# Patient Record
Sex: Female | Born: 1980 | Race: Black or African American | Hispanic: No | Smoking: Former smoker
Health system: Southern US, Community
[De-identification: ages and names within clinical notes are randomized; demographics above are authoritative.]

## PROBLEM LIST (undated history)

## (undated) ENCOUNTER — Inpatient Hospital Stay (HOSPITAL_COMMUNITY): Payer: Self-pay

## (undated) DIAGNOSIS — O139 Gestational [pregnancy-induced] hypertension without significant proteinuria, unspecified trimester: Secondary | ICD-10-CM

## (undated) DIAGNOSIS — R51 Headache: Secondary | ICD-10-CM

## (undated) DIAGNOSIS — D219 Benign neoplasm of connective and other soft tissue, unspecified: Secondary | ICD-10-CM

## (undated) DIAGNOSIS — R0602 Shortness of breath: Secondary | ICD-10-CM

## (undated) DIAGNOSIS — A749 Chlamydial infection, unspecified: Secondary | ICD-10-CM

## (undated) HISTORY — PX: TOOTH EXTRACTION: SUR596

---
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
## 2000-10-07 ENCOUNTER — Emergency Department (HOSPITAL_COMMUNITY): Admission: EM | Admit: 2000-10-07 | Discharge: 2000-10-07 | Payer: Self-pay | Admitting: Emergency Medicine

## 2000-10-25 ENCOUNTER — Inpatient Hospital Stay (HOSPITAL_COMMUNITY): Admission: AD | Admit: 2000-10-25 | Discharge: 2000-10-25 | Payer: Self-pay | Admitting: Obstetrics

## 2003-04-23 ENCOUNTER — Ambulatory Visit (HOSPITAL_COMMUNITY): Admission: RE | Admit: 2003-04-23 | Discharge: 2003-04-23 | Payer: Self-pay | Admitting: *Deleted

## 2003-04-29 ENCOUNTER — Inpatient Hospital Stay (HOSPITAL_COMMUNITY): Admission: AD | Admit: 2003-04-29 | Discharge: 2003-04-30 | Payer: Self-pay | Admitting: *Deleted

## 2003-05-14 ENCOUNTER — Inpatient Hospital Stay (HOSPITAL_COMMUNITY): Admission: AD | Admit: 2003-05-14 | Discharge: 2003-05-14 | Payer: Self-pay | Admitting: Obstetrics and Gynecology

## 2003-06-03 ENCOUNTER — Inpatient Hospital Stay (HOSPITAL_COMMUNITY): Admission: AD | Admit: 2003-06-03 | Discharge: 2003-06-03 | Payer: Self-pay | Admitting: Obstetrics and Gynecology

## 2003-06-18 ENCOUNTER — Ambulatory Visit (HOSPITAL_COMMUNITY): Admission: RE | Admit: 2003-06-18 | Discharge: 2003-06-18 | Payer: Self-pay | Admitting: *Deleted

## 2003-10-11 ENCOUNTER — Inpatient Hospital Stay (HOSPITAL_COMMUNITY): Admission: AD | Admit: 2003-10-11 | Discharge: 2003-10-11 | Payer: Self-pay | Admitting: Gynecology

## 2003-10-29 ENCOUNTER — Inpatient Hospital Stay (HOSPITAL_COMMUNITY): Admission: AD | Admit: 2003-10-29 | Discharge: 2003-10-29 | Payer: Self-pay | Admitting: Obstetrics and Gynecology

## 2003-10-31 ENCOUNTER — Inpatient Hospital Stay (HOSPITAL_COMMUNITY): Admission: AD | Admit: 2003-10-31 | Discharge: 2003-10-31 | Payer: Self-pay | Admitting: *Deleted

## 2003-11-05 ENCOUNTER — Inpatient Hospital Stay (HOSPITAL_COMMUNITY): Admission: AD | Admit: 2003-11-05 | Discharge: 2003-11-05 | Payer: Self-pay | Admitting: Obstetrics and Gynecology

## 2003-11-06 ENCOUNTER — Inpatient Hospital Stay (HOSPITAL_COMMUNITY): Admission: AD | Admit: 2003-11-06 | Discharge: 2003-11-06 | Payer: Self-pay | Admitting: Gynecology

## 2003-11-10 ENCOUNTER — Inpatient Hospital Stay (HOSPITAL_COMMUNITY): Admission: AD | Admit: 2003-11-10 | Discharge: 2003-11-16 | Payer: Self-pay | Admitting: Obstetrics and Gynecology

## 2003-11-10 ENCOUNTER — Ambulatory Visit: Payer: Self-pay | Admitting: Obstetrics and Gynecology

## 2004-06-22 ENCOUNTER — Emergency Department (HOSPITAL_COMMUNITY): Admission: EM | Admit: 2004-06-22 | Discharge: 2004-06-22 | Payer: Self-pay | Admitting: Emergency Medicine

## 2005-08-12 ENCOUNTER — Emergency Department (HOSPITAL_COMMUNITY): Admission: EM | Admit: 2005-08-12 | Discharge: 2005-08-12 | Payer: Self-pay | Admitting: Emergency Medicine

## 2006-12-06 ENCOUNTER — Emergency Department: Payer: Self-pay | Admitting: Emergency Medicine

## 2007-09-18 ENCOUNTER — Emergency Department: Payer: Self-pay | Admitting: Emergency Medicine

## 2008-10-17 ENCOUNTER — Emergency Department: Payer: Self-pay | Admitting: Emergency Medicine

## 2011-09-21 ENCOUNTER — Encounter (HOSPITAL_COMMUNITY): Payer: Self-pay | Admitting: *Deleted

## 2011-09-21 ENCOUNTER — Emergency Department (HOSPITAL_COMMUNITY)
Admission: EM | Admit: 2011-09-21 | Discharge: 2011-09-22 | Disposition: A | Discharge status: Home / Self Care | Payer: Self-pay | Attending: Emergency Medicine | Admitting: Emergency Medicine

## 2011-09-21 DIAGNOSIS — Z0389 Encounter for observation for other suspected diseases and conditions ruled out: Secondary | ICD-10-CM | POA: Insufficient documentation

## 2011-09-21 NOTE — ED Notes (Signed)
Patient reports she has had pain since Monday in the posterior area.  Patient states the pain is sharp and feels like a nail is being driving into her head.  Patient states she could see the veins popping in her head

## 2011-09-21 NOTE — ED Notes (Signed)
Called twice-no answer

## 2012-03-09 ENCOUNTER — Encounter (HOSPITAL_COMMUNITY): Payer: Self-pay

## 2012-03-09 ENCOUNTER — Inpatient Hospital Stay (HOSPITAL_COMMUNITY)
Admission: AD | Admit: 2012-03-09 | Discharge: 2012-03-09 | Disposition: A | Discharge status: Home / Self Care | Payer: Medicaid Other | Source: Ambulatory Visit | Attending: Obstetrics & Gynecology | Admitting: Obstetrics & Gynecology

## 2012-03-09 DIAGNOSIS — N390 Urinary tract infection, site not specified: Secondary | ICD-10-CM | POA: Insufficient documentation

## 2012-03-09 DIAGNOSIS — Z3201 Encounter for pregnancy test, result positive: Secondary | ICD-10-CM

## 2012-03-09 DIAGNOSIS — R109 Unspecified abdominal pain: Secondary | ICD-10-CM | POA: Insufficient documentation

## 2012-03-09 DIAGNOSIS — O2340 Unspecified infection of urinary tract in pregnancy, unspecified trimester: Secondary | ICD-10-CM

## 2012-03-09 DIAGNOSIS — O210 Mild hyperemesis gravidarum: Secondary | ICD-10-CM | POA: Insufficient documentation

## 2012-03-09 DIAGNOSIS — O98819 Other maternal infectious and parasitic diseases complicating pregnancy, unspecified trimester: Secondary | ICD-10-CM | POA: Insufficient documentation

## 2012-03-09 DIAGNOSIS — O2390 Unspecified genitourinary tract infection in pregnancy, unspecified trimester: Secondary | ICD-10-CM | POA: Insufficient documentation

## 2012-03-09 DIAGNOSIS — A5901 Trichomonal vulvovaginitis: Secondary | ICD-10-CM

## 2012-03-09 HISTORY — DX: Headache: R51

## 2012-03-09 LAB — URINALYSIS, ROUTINE W REFLEX MICROSCOPIC
Bilirubin Urine: NEGATIVE
Specific Gravity, Urine: 1.025 (ref 1.005–1.030)
pH: 6.5 (ref 5.0–8.0)

## 2012-03-09 LAB — URINE MICROSCOPIC-ADD ON

## 2012-03-09 MED ORDER — ONDANSETRON 8 MG PO TBDP
8.0000 mg | ORAL_TABLET | Freq: Once | ORAL | Status: DC
  Filled 2012-03-09: qty 1

## 2012-03-09 MED ORDER — PROMETHAZINE HCL 25 MG PO TABS
12.5000 mg | ORAL_TABLET | Freq: Four times a day (QID) | ORAL | Status: DC | PRN
Start: 2012-03-09 — End: 2012-04-22

## 2012-03-09 MED ORDER — METRONIDAZOLE 500 MG PO TABS
2000.0000 mg | ORAL_TABLET | Freq: Once | ORAL | Status: AC
  Administered 2012-03-09: 2000 mg via ORAL
  Filled 2012-03-09: qty 4

## 2012-03-09 MED ORDER — NITROFURANTOIN MONOHYD MACRO 100 MG PO CAPS: 100.0000 mg | ORAL_CAPSULE | Freq: Two times a day (BID) | ORAL | Status: AC

## 2012-03-09 NOTE — MAU Note (Signed)
Patient states she had a positive home pregnancy test on 11-28. To MAU for confirmation. States she has been having upper abdominal and nausea for about 2 weeks. Has only vomited a couple of times. Has headaches daily, had this am but not at this time. Denies any bleeding or discharge.

## 2012-03-09 NOTE — MAU Provider Note (Signed)
History     CSN: [id number]  Arrival date and time: 03/09/12 4098   First Provider Initiated Contact with Patient 03/09/12 1140      Chief Complaint  Patient presents with  . Possible Pregnancy  . Abdominal Pain   HPI Comments: Katie Garrett is a G2P1001 31 y.o. Female with an estimated gestational age of [redacted]w[redacted]d by LMP 12/14/11 who presents today for confirmation of pregnancy. Patient took an at home pregnancy test on 11/28 and needs a confirmation letter from [place] today. She reports some shooting pains up and down and lengthwise across her stomach that mostly bother her at night, but occur sometimes throughout the day. Also reports headaches and nausea everyday. Vomiting comes and goes, is not everyday. Has not taken anything at this time. Had daily nausea and vomiting with first pregnancy. Could not find a medication that was helpful during that time. Has not had prenatal care at this point. Also reports increased urinary frequency and dark urine even though patient is drinking lots of water.  Denies any urinary urgency, dysuria, hematuria, vaginal discharge, vaginal bleeding or pelvic pain.   Abdominal Pain Associated symptoms include frequency, headaches, nausea and vomiting. Pertinent negatives include no dysuria, fever or hematuria.     Past Medical History  Diagnosis Date  . Headache     Past Surgical History  Procedure Date  . Tooth extraction   . Cesarean section     History reviewed. No pertinent family history.  History  Substance Use Topics  . Smoking status: Current Every Day Smoker -- 0.2 packs/day    Types: Cigarettes  . Smokeless tobacco: Not on file  . Alcohol Use: No    Allergies: No Known Allergies  No prescriptions prior to admission    Review of Systems  Constitutional: Negative for fever and chills.  Gastrointestinal: Positive for nausea, vomiting and abdominal pain.  Genitourinary: Positive for frequency. Negative for dysuria, urgency and  hematuria.       Patient reports dark urine    Neurological: Positive for headaches.   Physical Exam   Blood pressure 120/73, pulse 76, temperature 98.8 F (37.1 C), temperature source Oral, resp. rate 16, height 4\' 11"  (1.499 m), weight 73.029 kg (161 lb), last menstrual period 12/14/2011, SpO2 100.00%.  Physical Exam  Nursing note and vitals reviewed. Constitutional: She is oriented to person, place, and time. She appears well-developed and well-nourished. She appears distressed.  GI: Soft. She exhibits no distension. There is no tenderness.  Neurological: She is alert and oriented to person, place, and time.  Skin: Skin is warm and dry.  Psychiatric: She has a normal mood and affect. Her behavior is normal.     Results for orders placed during the hospital encounter of 03/09/12 (from the past 24 hour(s))  URINALYSIS, ROUTINE W REFLEX MICROSCOPIC     Status: Abnormal   Collection Time   03/09/12 10:00 AM      Component Value Range   Color, Urine YELLOW  YELLOW   APPearance CLEAR  CLEAR   Specific Gravity, Urine 1.025  1.005 - 1.030   pH 6.5  5.0 - 8.0   Glucose, UA NEGATIVE  NEGATIVE mg/dL   Hgb urine dipstick MODERATE (*) NEGATIVE   Bilirubin Urine NEGATIVE  NEGATIVE   Ketones, ur NEGATIVE  NEGATIVE mg/dL   Protein, ur NEGATIVE  NEGATIVE mg/dL   Urobilinogen, UA 0.2  0.0 - 1.0 mg/dL   Nitrite NEGATIVE  NEGATIVE   Leukocytes, UA SMALL (*) NEGATIVE  URINE MICROSCOPIC-ADD ON     Status: Abnormal   Collection Time   03/09/12 10:00 AM      Component Value Range   Squamous Epithelial / LPF FEW (*) RARE   WBC, UA 11-20  <3 WBC/hpf   RBC / HPF 3-6  <3 RBC/hpf   Urine-Other MUCOUS PRESENT    POCT PREGNANCY, URINE     Status: Abnormal   Collection Time   03/09/12 10:03 AM      Component Value Range   Preg Test, Ur POSITIVE (*) NEGATIVE     MAU Course  Procedures FHR by Doppler: 158 bpm GC/Chlamydia probe (pending)   Assessment and Plan  A: 31 y.o. G59P1001 female  with estimated GA of [redacted]w[redacted]d by LMP 12/14/11 1. Positive pregnancy test  Patient to make appt. with Dr. Clearance Coots 2. Trichomonas Vaginalis  Flagyl 2000 mg once here  Zofran OTD 8 mg  Advised patient that partner needs to be treated also  No intercourse for 2 weeks after partner has been treated 3. UTI in pregnancy  Macrobid 100 mg BID x 7 days 4. Nausea in pregnancy  Phenergan 12.5 mg PRN     Marnee Spring 03/09/2012, 12:22 PM   I have seen this patient and agree with the above PA student's note.  Trichomonas present in urine on microscopic add on.  GC/Chlamydia collected and pending.   LEFTWICH-KIRBY, Rhianna Raulerson Certified Nurse-Midwife

## 2012-03-11 LAB — GC/CHLAMYDIA PROBE AMP
CT Probe RNA: NEGATIVE
GC Probe RNA: NEGATIVE

## 2012-03-12 NOTE — MAU Provider Note (Signed)
Attestation of Attending Supervision of Advanced Practitioner (CNM/NP): Evaluation and management procedures were performed by the Advanced Practitioner under my supervision and collaboration.  I have reviewed the Advanced Practitioner's note and chart, and I agree with the management and plan.  Raeleigh Guinn, MD, FACOG Attending Obstetrician & Gynecologist Faculty Practice, [facility]  

## 2012-04-04 HISTORY — PX: TUBAL LIGATION: SHX77

## 2012-04-22 ENCOUNTER — Encounter (HOSPITAL_COMMUNITY): Payer: Self-pay | Admitting: *Deleted

## 2012-04-22 ENCOUNTER — Inpatient Hospital Stay (HOSPITAL_COMMUNITY)
Admission: AD | Admit: 2012-04-22 | Discharge: 2012-04-22 | Disposition: A | Discharge status: Home / Self Care | Payer: Medicaid Other | Source: Ambulatory Visit | Attending: Obstetrics and Gynecology | Admitting: Obstetrics and Gynecology

## 2012-04-22 DIAGNOSIS — N390 Urinary tract infection, site not specified: Secondary | ICD-10-CM

## 2012-04-22 DIAGNOSIS — O2340 Unspecified infection of urinary tract in pregnancy, unspecified trimester: Secondary | ICD-10-CM

## 2012-04-22 DIAGNOSIS — O2390 Unspecified genitourinary tract infection in pregnancy, unspecified trimester: Secondary | ICD-10-CM

## 2012-04-22 DIAGNOSIS — R109 Unspecified abdominal pain: Secondary | ICD-10-CM | POA: Insufficient documentation

## 2012-04-22 DIAGNOSIS — O368190 Decreased fetal movements, unspecified trimester, not applicable or unspecified: Secondary | ICD-10-CM | POA: Insufficient documentation

## 2012-04-22 DIAGNOSIS — B9689 Other specified bacterial agents as the cause of diseases classified elsewhere: Secondary | ICD-10-CM

## 2012-04-22 DIAGNOSIS — N760 Acute vaginitis: Secondary | ICD-10-CM

## 2012-04-22 LAB — WET PREP, GENITAL
Trich, Wet Prep: NONE SEEN
Yeast Wet Prep HPF POC: NONE SEEN

## 2012-04-22 LAB — URINALYSIS, ROUTINE W REFLEX MICROSCOPIC
Bilirubin Urine: NEGATIVE
Glucose, UA: NEGATIVE mg/dL
Ketones, ur: NEGATIVE mg/dL
Leukocytes, UA: NEGATIVE
Nitrite: NEGATIVE
Protein, ur: NEGATIVE mg/dL
Specific Gravity, Urine: 1.025 (ref 1.005–1.030)
Urobilinogen, UA: 0.2 mg/dL (ref 0.0–1.0)
pH: 7 (ref 5.0–8.0)

## 2012-04-22 LAB — URINE MICROSCOPIC-ADD ON

## 2012-04-22 MED ORDER — CEPHALEXIN 500 MG PO CAPS: 500.0000 mg | ORAL_CAPSULE | Freq: Two times a day (BID) | ORAL | Status: DC

## 2012-04-22 MED ORDER — METRONIDAZOLE 500 MG PO TABS: 500.0000 mg | ORAL_TABLET | Freq: Two times a day (BID) | ORAL | Status: DC

## 2012-04-22 NOTE — MAU Note (Signed)
Pt states that she has not felt her baby and she was afraid. States some lower abdominal cramping

## 2012-04-22 NOTE — MAU Provider Note (Signed)
History     CSN: [id number]  Arrival date & time 04/22/12  1459   None     Chief Complaint  Patient presents with  . no fetal movement     (Consider location/radiation/quality/duration/timing/severity/associated sxs/prior treatment) HPI Arliss L Berneta Sages is a 32 y.o. G2P1001 at [redacted]w[redacted]d. She presents with concerns about fetal well being. She hasn't felt true fetal movement, has felt fluttering since December but not yesterday. She has noticed low ML pressure/crampng x 2 days.  No bleeding or spotting, no change in discharge, odor or itching. No UTI S&S or GI changes.  She has an appt 1/22 with [facility] to start prenatal care.   Past Medical History  Diagnosis Date  . Headache     Past Surgical History  Procedure Date  . Tooth extraction   . Cesarean section     History reviewed. No pertinent family history.  History  Substance Use Topics  . Smoking status: Current Every Day Smoker -- 0.2 packs/day    Types: Cigarettes  . Smokeless tobacco: Not on file  . Alcohol Use: No    OB History    Grav Para Term Preterm Abortions TAB SAB Ect Mult Living   2 1 1       1       Review of Systems  Constitutional: Negative for fever and chills.  Gastrointestinal: Negative for nausea, vomiting, diarrhea and constipation.  Genitourinary: Positive for pelvic pain. Negative for dysuria, urgency, frequency, vaginal bleeding and vaginal discharge.    Allergies  Review of patient's allergies indicates no known allergies.  Home Medications  No current outpatient prescriptions on file.  BP 136/75  Pulse 89  Resp 18  LMP 12/14/2011  Physical Exam  Constitutional: She is oriented to person, place, and time. She appears well-developed and well-nourished.  Abdominal: Soft. There is no tenderness.       + FHT's  Genitourinary:       Pelvic: Ext gen- nl anatomy, skin intact Vagina- small amt bright white discharge Cx= parous, long,closed Uterus- 18-20 wk size Adn- non tender    Musculoskeletal: Normal range of motion.  Neurological: She is alert and oriented to person, place, and time.  Skin: Skin is warm and dry.  Psychiatric: She has a normal mood and affect. Her behavior is normal.    ED Course  Procedures (including critical care time)    Results for orders placed during the hospital encounter of 04/22/12 (from the past 24 hour(s))  URINALYSIS, ROUTINE W REFLEX MICROSCOPIC     Status: Abnormal   Collection Time   04/22/12  3:00 PM      Component Value Range   Color, Urine YELLOW  YELLOW   APPearance HAZY (*) CLEAR   Specific Gravity, Urine 1.025  1.005 - 1.030   pH 7.0  5.0 - 8.0   Glucose, UA NEGATIVE  NEGATIVE mg/dL   Hgb urine dipstick TRACE (*) NEGATIVE   Bilirubin Urine NEGATIVE  NEGATIVE   Ketones, ur NEGATIVE  NEGATIVE mg/dL   Protein, ur NEGATIVE  NEGATIVE mg/dL   Urobilinogen, UA 0.2  0.0 - 1.0 mg/dL   Nitrite NEGATIVE  NEGATIVE   Leukocytes, UA NEGATIVE  NEGATIVE  URINE MICROSCOPIC-ADD ON     Status: Abnormal   Collection Time   04/22/12  3:00 PM      Component Value Range   Squamous Epithelial / LPF FEW (*) RARE   WBC, UA 3-6  <3 WBC/hpf   RBC / HPF 0-2  <  3 RBC/hpf   Bacteria, UA MANY (*) RARE  WET PREP, GENITAL     Status: Abnormal   Collection Time   04/22/12  4:00 PM      Component Value Range   Yeast Wet Prep HPF POC NONE SEEN  NONE SEEN   Trich, Wet Prep NONE SEEN  NONE SEEN   Clue Cells Wet Prep HPF POC MODERATE (*) NONE SEEN   WBC, Wet Prep HPF POC FEW (*) NONE SEEN    ASSESSMENT:  18 + wks EGA with + FHT's Urinary tract infection Bacterial vaginosis   PLAN:  C&S on urine, tx with Keflex 500 bid x 7 d Flagyl 500 bid x 7 d Keep appt 1/22 with [facility] to start prenatal care    MDM

## 2012-04-23 LAB — GC/CHLAMYDIA PROBE AMP
CT Probe RNA: NEGATIVE
GC Probe RNA: NEGATIVE

## 2012-04-23 LAB — URINE CULTURE

## 2012-04-25 ENCOUNTER — Other Ambulatory Visit: Payer: Self-pay | Admitting: Nurse Practitioner

## 2012-04-25 DIAGNOSIS — Z3689 Encounter for other specified antenatal screening: Secondary | ICD-10-CM

## 2012-04-27 ENCOUNTER — Ambulatory Visit (HOSPITAL_COMMUNITY)
Admission: RE | Admit: 2012-04-27 | Discharge: 2012-04-27 | Disposition: A | Discharge status: Home / Self Care | Payer: Medicaid Other | Source: Ambulatory Visit | Attending: Nurse Practitioner | Admitting: Nurse Practitioner

## 2012-04-27 DIAGNOSIS — Z363 Encounter for antenatal screening for malformations: Secondary | ICD-10-CM | POA: Insufficient documentation

## 2012-04-27 DIAGNOSIS — Z3689 Encounter for other specified antenatal screening: Secondary | ICD-10-CM

## 2012-04-27 DIAGNOSIS — O0930 Supervision of pregnancy with insufficient antenatal care, unspecified trimester: Secondary | ICD-10-CM | POA: Insufficient documentation

## 2012-04-27 DIAGNOSIS — Z1389 Encounter for screening for other disorder: Secondary | ICD-10-CM | POA: Insufficient documentation

## 2012-04-27 DIAGNOSIS — O358XX0 Maternal care for other (suspected) fetal abnormality and damage, not applicable or unspecified: Secondary | ICD-10-CM | POA: Insufficient documentation

## 2012-05-01 NOTE — MAU Provider Note (Signed)
Attestation of Attending Supervision of Advanced Practitioner: Evaluation and management procedures were performed by the PA/NP/CNM/OB Fellow under my supervision/collaboration. Chart reviewed and agree with management and plan.  Angelino Rumery V 05/01/2012 4:11 PM

## 2012-08-23 ENCOUNTER — Encounter: Payer: Self-pay | Admitting: *Deleted

## 2012-08-30 ENCOUNTER — Inpatient Hospital Stay (HOSPITAL_COMMUNITY)
Admission: AD | Admit: 2012-08-30 | Discharge: 2012-09-03 | DRG: 766 | Disposition: A | Discharge status: Home / Self Care | Payer: Medicaid Other | Source: Ambulatory Visit | Attending: Family Medicine | Admitting: Family Medicine

## 2012-08-30 ENCOUNTER — Inpatient Hospital Stay (HOSPITAL_COMMUNITY): Payer: Medicaid Other

## 2012-08-30 ENCOUNTER — Encounter (HOSPITAL_COMMUNITY): Payer: Self-pay | Admitting: *Deleted

## 2012-08-30 DIAGNOSIS — O330 Maternal care for disproportion due to deformity of maternal pelvic bones: Secondary | ICD-10-CM | POA: Diagnosis present

## 2012-08-30 DIAGNOSIS — O1493 Unspecified pre-eclampsia, third trimester: Secondary | ICD-10-CM

## 2012-08-30 DIAGNOSIS — Z98891 History of uterine scar from previous surgery: Secondary | ICD-10-CM

## 2012-08-30 DIAGNOSIS — O339 Maternal care for disproportion, unspecified: Secondary | ICD-10-CM | POA: Diagnosis present

## 2012-08-30 DIAGNOSIS — O34219 Maternal care for unspecified type scar from previous cesarean delivery: Secondary | ICD-10-CM | POA: Diagnosis present

## 2012-08-30 DIAGNOSIS — IMO0002 Reserved for concepts with insufficient information to code with codable children: Principal | ICD-10-CM | POA: Diagnosis present

## 2012-08-30 DIAGNOSIS — O324XX0 Maternal care for high head at term, not applicable or unspecified: Secondary | ICD-10-CM | POA: Diagnosis present

## 2012-08-30 HISTORY — DX: Gestational (pregnancy-induced) hypertension without significant proteinuria, unspecified trimester: O13.9

## 2012-08-30 HISTORY — DX: Shortness of breath: R06.02

## 2012-08-30 HISTORY — DX: Chlamydial infection, unspecified: A74.9

## 2012-08-30 LAB — CREATININE, URINE, 24 HOUR
Collection Interval-UCRE24: 24 hours
Creatinine, 24H Ur: 1778 mg/d (ref 700–1800)
Creatinine, Urine: 79 mg/dL
Urine Total Volume-UCRE24: 2250 mL

## 2012-08-30 LAB — OB RESULTS CONSOLE RPR: RPR: NONREACTIVE

## 2012-08-30 LAB — PROTEIN, URINE, 24 HOUR
Collection Interval-UPROT: 24 hours
Urine Total Volume-UPROT: 2250 mL

## 2012-08-30 LAB — CBC
HCT: 29.1 % — ABNORMAL LOW (ref 36.0–46.0)
Hemoglobin: 9.2 g/dL — ABNORMAL LOW (ref 12.0–15.0)
RBC: 3.95 MIL/uL (ref 3.87–5.11)
WBC: 8.7 10*3/uL (ref 4.0–10.5)

## 2012-08-30 LAB — OB RESULTS CONSOLE HIV ANTIBODY (ROUTINE TESTING): HIV: NONREACTIVE

## 2012-08-30 LAB — URINALYSIS, ROUTINE W REFLEX MICROSCOPIC
Ketones, ur: NEGATIVE mg/dL
Leukocytes, UA: NEGATIVE
Nitrite: NEGATIVE
Protein, ur: NEGATIVE mg/dL
Urobilinogen, UA: 0.2 mg/dL (ref 0.0–1.0)

## 2012-08-30 LAB — URINE MICROSCOPIC-ADD ON

## 2012-08-30 LAB — COMPREHENSIVE METABOLIC PANEL
ALT: 12 U/L (ref 0–35)
BUN: 6 mg/dL (ref 6–23)
Calcium: 8.7 mg/dL (ref 8.4–10.5)
Creatinine, Ser: 0.69 mg/dL (ref 0.50–1.10)
GFR calc Af Amer: >90 mL/min (ref >90)
Glucose, Bld: 103 mg/dL — ABNORMAL HIGH (ref 70–99)
Sodium: 137 mEq/L (ref 135–145)
Total Protein: 6.1 g/dL (ref 6.0–8.3)

## 2012-08-30 LAB — OB RESULTS CONSOLE HEPATITIS B SURFACE ANTIGEN: Hepatitis B Surface Ag: NEGATIVE

## 2012-08-30 LAB — RAPID HIV SCREEN (WH-MAU): Rapid HIV Screen: NONREACTIVE

## 2012-08-30 LAB — RPR: RPR Ser Ql: NONREACTIVE

## 2012-08-30 LAB — OB RESULTS CONSOLE RUBELLA ANTIBODY, IGM: Rubella: IMMUNE

## 2012-08-30 LAB — OB RESULTS CONSOLE ANTIBODY SCREEN: Antibody Screen: NEGATIVE

## 2012-08-30 MED ORDER — IBUPROFEN 600 MG PO TABS
600.0000 mg | ORAL_TABLET | Freq: Four times a day (QID) | ORAL | Status: DC | PRN
  Administered 2012-08-30: 600 mg via ORAL
  Filled 2012-08-30: qty 1

## 2012-08-30 MED ORDER — LACTATED RINGERS IV SOLN: 500.0000 mL | INTRAVENOUS | Status: DC | PRN

## 2012-08-30 MED ORDER — CITRIC ACID-SODIUM CITRATE 334-500 MG/5ML PO SOLN
30.0000 mL | ORAL | Status: DC | PRN
  Administered 2012-09-01: 30 mL via ORAL
  Filled 2012-08-30: qty 15

## 2012-08-30 MED ORDER — LACTATED RINGERS IV SOLN
INTRAVENOUS | Status: DC
  Administered 2012-08-30 – 2012-09-01 (×3): via INTRAVENOUS

## 2012-08-30 MED ORDER — OXYTOCIN 40 UNITS IN LACTATED RINGERS INFUSION - SIMPLE MED: 62.5000 mL/h | INTRAVENOUS | Status: DC

## 2012-08-30 MED ORDER — ACETAMINOPHEN 325 MG PO TABS
650.0000 mg | ORAL_TABLET | ORAL | Status: DC | PRN
  Administered 2012-08-30: 650 mg via ORAL
  Filled 2012-08-30: qty 2

## 2012-08-30 MED ORDER — NALBUPHINE SYRINGE 5 MG/0.5 ML
10.0000 mg | INJECTION | INTRAMUSCULAR | Status: DC | PRN
  Administered 2012-08-31 – 2012-09-01 (×4): 10 mg via INTRAVENOUS
  Filled 2012-08-30 (×4): qty 1

## 2012-08-30 MED ORDER — MAGNESIUM SULFATE BOLUS VIA INFUSION
4.0000 g | Freq: Once | INTRAVENOUS | Status: DC
  Filled 2012-08-30: qty 500

## 2012-08-30 MED ORDER — LIDOCAINE HCL (PF) 1 % IJ SOLN: 30.0000 mL | INTRAMUSCULAR | Status: DC | PRN

## 2012-08-30 MED ORDER — ONDANSETRON HCL 4 MG/2ML IJ SOLN
4.0000 mg | Freq: Four times a day (QID) | INTRAMUSCULAR | Status: DC | PRN
  Administered 2012-08-31: 4 mg via INTRAVENOUS
  Filled 2012-08-30: qty 2

## 2012-08-30 MED ORDER — OXYTOCIN BOLUS FROM INFUSION: 500.0000 mL | INTRAVENOUS | Status: DC

## 2012-08-30 MED ORDER — OXYCODONE-ACETAMINOPHEN 5-325 MG PO TABS: 1.0000 | ORAL_TABLET | ORAL | Status: DC | PRN

## 2012-08-30 MED ORDER — TERBUTALINE SULFATE 1 MG/ML IJ SOLN: 0.2500 mg | Freq: Once | INTRAMUSCULAR | Status: AC | PRN

## 2012-08-30 MED ORDER — MAGNESIUM SULFATE 40 G IN LACTATED RINGERS - SIMPLE
2.0000 g/h | INTRAVENOUS | Status: AC
  Administered 2012-08-30: 4 g/h via INTRAVENOUS
  Administered 2012-08-31: 2 g/h via INTRAVENOUS
  Filled 2012-08-30 (×3): qty 500

## 2012-08-30 NOTE — MAU Note (Signed)
Patient sent from clinic for evaluation of elevated blood pressure.

## 2012-08-30 NOTE — H&P (Signed)
Katie Garrett is a 32 y.o. female presenting for elevated blood pressure.   History  32 y.o. G2P1001 at [redacted]w[redacted]d [facility]. On Tuesday, BPs were 140s/90s. Today she had a BP of 150s/90s. She has collected a 24-hour urine sample and brings that with her today. She denies headache, vision changes, RUQ pain, nausea, vomiting. Baby is moving well and she has no contractions, bleeding or loss of fluid.   [facility] and has had no problems with this pregnancy. Her previous pregnancy was delivered by c-section due to non-reassuring fetal heart tones. She states she dilated to 5 cm.    OB History   Grav Para Term Preterm Abortions TAB SAB Ect Mult Living   2 1 1       1      Past Medical History  Diagnosis Date  . ZOXWRUEA(540.9)    Past Surgical History  Procedure Laterality Date  . Tooth extraction    . Cesarean section     Family History: family history is not on file. Social History:  reports that she quit smoking about 4 months ago. Her smoking use included Cigarettes. She smoked 0.25 packs per day. She does not have any smokeless tobacco history on file. She reports that she does not drink alcohol or use illicit drugs.   Prenatal Transfer Tool  Maternal Diabetes: No Genetic Screening: Abnormal:  Results: Elevated AFP (borderline) Maternal Ultrasounds/Referrals: Normal Fetal Ultrasounds or other Referrals:  None Maternal Substance Abuse:  No Significant Maternal Medications:  None Significant Maternal Lab Results:  Lab values include: Group B Strep negative Other Comments:  None  ROS  Pertinent pos and neg listed in HPI    Blood pressure 154/91, pulse 77, temperature 97.9 F (36.6 C), temperature source Oral, resp. rate 20, last menstrual period 12/14/2011, SpO2 100.00%. Maternal Exam:  Uterine Assessment: Contraction strength is mild.  Contraction frequency is irregular.   Abdomen: Patient reports no  abdominal tenderness. Surgical scars: low transverse.   Fetal presentation: vertex  Introitus: Normal vulva. Normal vagina.  Ferning test: not done.  Nitrazine test: not done.  Pelvis: questionable for delivery.   Cervix: Cervix evaluated by digital exam.     Fetal Exam Fetal Monitor Review: Mode: ultrasound.   Baseline rate: 135.  Variability: moderate (6-25 bpm).   Pattern: accelerations present and no decelerations.    Fetal State Assessment: Category I - tracings are normal.     Physical Exam  Constitutional: She is oriented to person, place, and time. She appears well-developed and well-nourished. No distress.  HENT:  Head: Normocephalic and atraumatic.  Eyes: Conjunctivae and EOM are normal.  Neck: Normal range of motion. Neck supple.  Cardiovascular: Normal rate, regular rhythm and normal heart sounds.   Respiratory: Breath sounds normal. No respiratory distress.  GI: Soft. Bowel sounds are normal.  Musculoskeletal: Normal range of motion. She exhibits edema (1+). She exhibits no tenderness.  Neurological: She is alert and oriented to person, place, and time. She has normal reflexes.  Skin: Skin is warm and dry.  Psychiatric: She has a normal mood and affect.      Prenatal labs: ABO, Rh:   Antibody:   Rubella:   RPR:    HBsAg:    HIV:    GBS:     Assessment/Plan: 32 y.o. G2P1001 at [redacted]w[redacted]d with Preeclampsia with prior C-section - desires VBAC and BTL - Admit to L&D for induction of  labor - TOLAC consent signed - BTL consent signed - good after 08/18/12 - GBS negative - Mag - Monitor BP, treat if >160/110 - Foley bulb if possible or pitocin    Napoleon Form 08/30/2012, 3:22 PM

## 2012-08-30 NOTE — H&P (Signed)
Chart reviewed and agree with management and plan.  

## 2012-08-31 LAB — CBC
HCT: 33.3 % — ABNORMAL LOW (ref 36.0–46.0)
Hemoglobin: 10.7 g/dL — ABNORMAL LOW (ref 12.0–15.0)
RBC: 4.51 MIL/uL (ref 3.87–5.11)
RDW: 14.9 % (ref 11.5–15.5)
WBC: 14.8 10*3/uL — ABNORMAL HIGH (ref 4.0–10.5)

## 2012-08-31 MED ORDER — EPHEDRINE 5 MG/ML INJ: 10.0000 mg | INTRAVENOUS | Status: DC | PRN

## 2012-08-31 MED ORDER — TERBUTALINE SULFATE 1 MG/ML IJ SOLN: 0.2500 mg | Freq: Once | INTRAMUSCULAR | Status: AC | PRN

## 2012-08-31 MED ORDER — MORPHINE SULFATE 0.5 MG/ML IJ SOLN
INTRAMUSCULAR | Status: AC
  Filled 2012-08-31: qty 10

## 2012-08-31 MED ORDER — OXYTOCIN 40 UNITS IN LACTATED RINGERS INFUSION - SIMPLE MED
1.0000 m[IU]/min | INTRAVENOUS | Status: DC
  Administered 2012-08-31: 2 m[IU]/min via INTRAVENOUS
  Filled 2012-08-31: qty 1000

## 2012-08-31 MED ORDER — PHENYLEPHRINE 40 MCG/ML (10ML) SYRINGE FOR IV PUSH (FOR BLOOD PRESSURE SUPPORT): 80.0000 ug | PREFILLED_SYRINGE | INTRAVENOUS | Status: DC | PRN

## 2012-08-31 MED ORDER — LACTATED RINGERS IV SOLN: 500.0000 mL | Freq: Once | INTRAVENOUS | Status: DC

## 2012-08-31 MED ORDER — SODIUM BICARBONATE 8.4 % IV SOLN
INTRAVENOUS | Status: AC
  Filled 2012-08-31: qty 50

## 2012-08-31 MED ORDER — OXYTOCIN 10 UNIT/ML IJ SOLN
INTRAMUSCULAR | Status: AC
  Filled 2012-08-31: qty 4

## 2012-08-31 MED ORDER — ONDANSETRON HCL 4 MG/2ML IJ SOLN
INTRAMUSCULAR | Status: AC
  Filled 2012-08-31: qty 2

## 2012-08-31 MED ORDER — DIPHENHYDRAMINE HCL 50 MG/ML IJ SOLN: 12.5000 mg | INTRAMUSCULAR | Status: DC | PRN

## 2012-08-31 MED ORDER — LIDOCAINE-EPINEPHRINE (PF) 2 %-1:200000 IJ SOLN
INTRAMUSCULAR | Status: AC
  Filled 2012-08-31: qty 20

## 2012-08-31 MED ORDER — LABETALOL HCL 5 MG/ML IV SOLN: 20.0000 mg | INTRAVENOUS | Status: DC | PRN

## 2012-08-31 MED ORDER — FENTANYL 2.5 MCG/ML BUPIVACAINE 1/10 % EPIDURAL INFUSION (WH - ANES): 14.0000 mL/h | INTRAMUSCULAR | Status: DC | PRN

## 2012-08-31 NOTE — Progress Notes (Signed)
Katie Garrett is a 32 y.o. G2P1001 at [redacted]w[redacted]d by ultrasound admitted for induction of labor due to Pre-eclamptic toxemia of pregnancy..  Subjective: Pt has contracted regularly, used ball to aid position, still c slight bloody show.  Objective: BP 149/86  Pulse 89  Temp(Src) 98.7 F (37.1 C) (Oral)  Resp 20  Ht 5\' 1"  (1.549 m)  Wt 83.462 kg (184 lb)  BMI 34.78 kg/m2  SpO2 100%  LMP 12/14/2011 I/O last 3 completed shifts: In: 4657.1 [P.O.:1360; I.V.:3297.1] Out: 2100 [Urine:2100] Total I/O In: 723.6 [P.O.:340; I.V.:383.6] Out: 1125 [Urine:1125]  FHT:  FHR: 135 bpm, variability: minimal ,  accelerations:  Present,  decelerations:  Absent UC:   irregular, every 3-4 minutes SVE:   Dilation: 1 Effacement (%): Thick Station: Ballotable Exam by:: Dr Gaye Pollack remains out of pelvis.  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  Patient accepts lack of progress as indicative of narrow pelvis. Cesarean section discussed, including risks to adjacent organs, risk of bleeding or transfusion. Will recheck platelets . Type and screen renewed. To OR at 12:30 for repeat cesarean and tubal ligation. Consents signed over 30 days (4/18)  Hiliana Eilts V 08/31/2012, 11:28 PM

## 2012-08-31 NOTE — Progress Notes (Signed)
Katie Garrett is a 32 y.o. G2P1001 at [redacted]w[redacted]d by ultrasound admitted for induction of labor due to preeclampsia, with elevated BP and proteinuria 450 mg/d.  Subjective: Pt having contractions mild intensity, uncomfotable to pt. Foley bulb has been in place x 28 hours, on Pitocin now at 9 mU/min with contractions q 4 min.  Pt still desiring to continue with TOLAC. "I just want the joy of pushing my baby out"   Objective: BP 147/81  Pulse 85  Temp(Src) 98.7 F (37.1 C) (Oral)  Resp 18  Ht 5\' 1"  (1.549 m)  Wt 83.462 kg (184 lb)  BMI 34.78 kg/m2  SpO2 100%  LMP 12/14/2011 I/O last 3 completed shifts: In: 4657.1 [P.O.:1360; I.V.:3297.1] Out: 2100 [Urine:2100] Total I/O In: 133.6 [I.V.:133.6] Out: 0  BP 147/81  Pulse 85  Temp(Src) 98.7 F (37.1 C) (Oral)  Resp 18  Ht 5\' 1"  (1.549 m)  Wt 83.462 kg (184 lb)  BMI 34.78 kg/m2  SpO2 100%  LMP 12/14/2011  FHT:  FHR: 135 bpm, variability: moderate,  accelerations:  Present,  decelerations:  Absent UC:   irregular, every 3-5 minutes SVE:   Dilation: 1 Effacement (%): Thick Station:  (not in pelvis) Exam by:: Dr. Emelda Garrett fOLEY BULB STILL IN CERVIX, BLOODY SHOW HAS BEGUN. PELVIS IS VERY NARROW, AND vertex is out of the pelvis, confimed as vertex by u/s at bedside by me, with fetus in midline,spine anterior, Vertex direct OA. Nuchal cord visible on u.s. Labs: Lab Results  Component Value Date   WBC 8.7 08/30/2012   HGB 9.2* 08/30/2012   HCT 29.1* 08/30/2012   MCV 73.7* 08/30/2012   PLT 207 08/30/2012    Assessment / Plan: Induction of labor due to preeclampsia,  progressing well on pitocin with foley bulb in cervix,  Narrow pelvis,  Prior cesarean at C/C , DUE TO nrfht,  Doubtful pelvic diameters.  Labor: will continue with oxytocin,and foley. will allow pt to use ball to sit on,  Preeclampsia:  on magnesium sulfate Fetal Wellbeing:  Category I Pain Control:  Labor support without medications I/D:  n/a Anticipated MOD:  I  am doubtful of adequacy of pelvis, but will allow pt to continue per her strong preference, recheck q 2 hrs.  Katie Garrett V 08/31/2012, 8:43 PM

## 2012-08-31 NOTE — Progress Notes (Signed)
Patient ID: DELAINEY WINSTANLEY, female   DOB: [birth year], 31 y.o.   MRN: [id number]   S:  Pt having nausea/vomiting. Had headache last night. Felt some ctx last night but none now. Foley bulb still in.  O:  Filed Vitals:   08/31/12 0603 08/31/12 0703 08/31/12 0802 08/31/12 0903  BP: 144/82 143/87 148/96 141/90  Pulse: 77 75 84 83  Temp:   98 F (36.7 C)   TempSrc:   Oral   Resp: 18 18 18 18   Height:      Weight:      SpO2:       Cervix:  Check deferred  FHTs:  120, mod var, accels present, no decels  TOCO:  occ ctx  A/P 32 y.o. G2P1001 at [redacted]w[redacted]d with Preeclampsia - BP controlled, on mag - Start low dose pitocin  - TOLAC - monitor closely  Napoleon Form, MD

## 2012-08-31 NOTE — Progress Notes (Signed)
Katie Garrett is a 32 y.o. G2P1001 at [redacted]w[redacted]d   Subjective: Rested during the night; intermittent H/A relieved with Nubain; on magnesium  Objective: BP 148/96  Pulse 84  Temp(Src) 98 F (36.7 C) (Oral)  Resp 18  Ht 5\' 1"  (1.549 m)  Wt 83.462 kg (184 lb)  BMI 34.78 kg/m2  SpO2 100%  LMP 12/14/2011 I/O last 3 completed shifts: In: 2465.4 [P.O.:640; I.V.:1825.4] Out: 925 [Urine:925] Total I/O In: 25 [I.V.:25] Out: 150 [Urine:150]  FHT:  FHR: 120 bpm, variability: moderate,  accelerations:  Present,  decelerations:  Absent UC:   Rare ctx; no pattern SVE:   Dilation: Closed Effacement (%): Thick Station: -3 Exam by:: Dr. Thad Ranger- foley bulb with tension still in place (cx not examined)  Labs: Lab Results  Component Value Date   WBC 8.7 08/30/2012   HGB 9.2* 08/30/2012   HCT 29.1* 08/30/2012   MCV 73.7* 08/30/2012   PLT 207 08/30/2012    Assessment / Plan: IOL process TOLAC Preeclampsia on magnesium Unfavorable cx  Continue with foley bulb ripening; possibly add Pitocin   Katie Garrett 08/31/2012, 8:12 AM

## 2012-08-31 NOTE — Progress Notes (Signed)
Patient ID: Katie Garrett, female   DOB: 08/07/1980, 32 y.o.   MRN: [id number]   S: Pt states pain with ctx is getting strong. Foley bulb still in.  O: Filed Vitals:   08/31/12 1403 08/31/12 1502 08/31/12 1603 08/31/12 1702  BP: 152/91 153/89 154/86 147/93  Pulse: 91 93 98 94  Temp:      TempSrc:      Resp: 18 18 18 18   Height:      Weight:      SpO2:        CERV:  2cm effacement difficult to determine with FB in place, -2 station  FHTs:  130, mod var, accels present, no decels TOCO:   q 3-5 min  A/P 32 y.o. G2P1001 at [redacted]w[redacted]d here for IOL for Preeclampsia, TOLAC - BP not severe range, no severe symptoms - Continue magnesium - FHTs reactive - No progress.  Will continue to increase pitocin.  Napoleon Form, MD

## 2012-09-01 ENCOUNTER — Inpatient Hospital Stay (HOSPITAL_COMMUNITY): Payer: Medicaid Other | Admitting: Anesthesiology

## 2012-09-01 ENCOUNTER — Encounter (HOSPITAL_COMMUNITY)
Admission: AD | Disposition: A | Discharge status: Home / Self Care | Payer: Self-pay | Source: Ambulatory Visit | Attending: Family Medicine

## 2012-09-01 ENCOUNTER — Encounter (HOSPITAL_COMMUNITY): Payer: Self-pay | Admitting: Anesthesiology

## 2012-09-01 DIAGNOSIS — Z302 Encounter for sterilization: Secondary | ICD-10-CM

## 2012-09-01 DIAGNOSIS — IMO0002 Reserved for concepts with insufficient information to code with codable children: Secondary | ICD-10-CM

## 2012-09-01 DIAGNOSIS — O324XX0 Maternal care for high head at term, not applicable or unspecified: Secondary | ICD-10-CM

## 2012-09-01 DIAGNOSIS — O339 Maternal care for disproportion, unspecified: Secondary | ICD-10-CM | POA: Diagnosis not present

## 2012-09-01 LAB — CBC
HCT: 30.7 % — ABNORMAL LOW (ref 36.0–46.0)
Hemoglobin: 9.8 g/dL — ABNORMAL LOW (ref 12.0–15.0)
WBC: 13.8 10*3/uL — ABNORMAL HIGH (ref 4.0–10.5)

## 2012-09-01 SURGERY — Surgical Case
Anesthesia: Spinal | Site: Abdomen | Wound class: Clean Contaminated

## 2012-09-01 MED ORDER — WITCH HAZEL-GLYCERIN EX PADS: 1.0000 "application " | MEDICATED_PAD | CUTANEOUS | Status: DC | PRN

## 2012-09-01 MED ORDER — KETOROLAC TROMETHAMINE 30 MG/ML IJ SOLN: 30.0000 mg | Freq: Four times a day (QID) | INTRAMUSCULAR | Status: AC | PRN

## 2012-09-01 MED ORDER — LACTATED RINGERS IV SOLN
INTRAVENOUS | Status: DC
  Administered 2012-09-01 – 2012-09-02 (×3): via INTRAVENOUS

## 2012-09-01 MED ORDER — BUPIVACAINE IN DEXTROSE 0.75-8.25 % IT SOLN
INTRATHECAL | Status: DC | PRN
  Administered 2012-09-01: 1.5 mg via INTRATHECAL

## 2012-09-01 MED ORDER — SCOPOLAMINE 1 MG/3DAYS TD PT72
1.0000 | MEDICATED_PATCH | Freq: Once | TRANSDERMAL | Status: DC
  Administered 2012-09-01: 1.5 mg via TRANSDERMAL

## 2012-09-01 MED ORDER — MORPHINE SULFATE (PF) 0.5 MG/ML IJ SOLN
INTRAMUSCULAR | Status: DC | PRN
  Administered 2012-09-01: .1 mg via EPIDURAL

## 2012-09-01 MED ORDER — LACTATED RINGERS IV SOLN: INTRAVENOUS | Status: DC

## 2012-09-01 MED ORDER — OXYTOCIN 40 UNITS IN LACTATED RINGERS INFUSION - SIMPLE MED: 62.5000 mL/h | INTRAVENOUS | Status: AC

## 2012-09-01 MED ORDER — EPHEDRINE 5 MG/ML INJ
INTRAVENOUS | Status: AC
  Filled 2012-09-01: qty 10

## 2012-09-01 MED ORDER — SIMETHICONE 80 MG PO CHEW
80.0000 mg | CHEWABLE_TABLET | Freq: Three times a day (TID) | ORAL | Status: DC
  Administered 2012-09-01 – 2012-09-02 (×5): 80 mg via ORAL

## 2012-09-01 MED ORDER — CEFAZOLIN SODIUM-DEXTROSE 2-3 GM-% IV SOLR
INTRAVENOUS | Status: DC | PRN
  Administered 2012-09-01: 2 g via INTRAVENOUS

## 2012-09-01 MED ORDER — ONDANSETRON HCL 4 MG/2ML IJ SOLN
INTRAMUSCULAR | Status: DC | PRN
  Administered 2012-09-01: 4 mg via INTRAVENOUS

## 2012-09-01 MED ORDER — NALOXONE HCL 0.4 MG/ML IJ SOLN: 0.4000 mg | INTRAMUSCULAR | Status: DC | PRN

## 2012-09-01 MED ORDER — NALBUPHINE SYRINGE 5 MG/0.5 ML
5.0000 mg | INJECTION | INTRAMUSCULAR | Status: DC | PRN
  Filled 2012-09-01: qty 1

## 2012-09-01 MED ORDER — OXYCODONE-ACETAMINOPHEN 5-325 MG PO TABS
1.0000 | ORAL_TABLET | ORAL | Status: DC | PRN
  Administered 2012-09-02: 1 via ORAL
  Filled 2012-09-01: qty 1

## 2012-09-01 MED ORDER — SIMETHICONE 80 MG PO CHEW: 80.0000 mg | CHEWABLE_TABLET | ORAL | Status: DC | PRN

## 2012-09-01 MED ORDER — MORPHINE SULFATE (PF) 0.5 MG/ML IJ SOLN
INTRAMUSCULAR | Status: DC | PRN
  Administered 2012-09-01: 4.9 mg via INTRAVENOUS

## 2012-09-01 MED ORDER — ONDANSETRON HCL 4 MG PO TABS: 4.0000 mg | ORAL_TABLET | ORAL | Status: DC | PRN

## 2012-09-01 MED ORDER — ONDANSETRON HCL 4 MG/2ML IJ SOLN: 4.0000 mg | INTRAMUSCULAR | Status: DC | PRN

## 2012-09-01 MED ORDER — SENNOSIDES-DOCUSATE SODIUM 8.6-50 MG PO TABS
2.0000 | ORAL_TABLET | Freq: Every day | ORAL | Status: DC
Start: 2012-09-01 — End: 2012-09-03

## 2012-09-01 MED ORDER — PRENATAL MULTIVITAMIN CH
1.0000 | ORAL_TABLET | Freq: Every day | ORAL | Status: DC
  Filled 2012-09-01: qty 1

## 2012-09-01 MED ORDER — DIPHENHYDRAMINE HCL 50 MG/ML IJ SOLN: 12.5000 mg | INTRAMUSCULAR | Status: DC | PRN

## 2012-09-01 MED ORDER — IBUPROFEN 600 MG PO TABS
600.0000 mg | ORAL_TABLET | Freq: Four times a day (QID) | ORAL | Status: DC
Start: 2012-09-01 — End: 2012-09-02
  Administered 2012-09-01: 600 mg via ORAL
  Filled 2012-09-01 (×4): qty 1

## 2012-09-01 MED ORDER — ZOLPIDEM TARTRATE 5 MG PO TABS
5.0000 mg | ORAL_TABLET | Freq: Every evening | ORAL | Status: DC | PRN
  Filled 2012-09-01: qty 1

## 2012-09-01 MED ORDER — MEPERIDINE HCL 25 MG/ML IJ SOLN: 6.2500 mg | INTRAMUSCULAR | Status: DC | PRN

## 2012-09-01 MED ORDER — KETOROLAC TROMETHAMINE 60 MG/2ML IM SOLN
60.0000 mg | Freq: Once | INTRAMUSCULAR | Status: AC | PRN
  Administered 2012-09-01: 60 mg via INTRAMUSCULAR

## 2012-09-01 MED ORDER — ONDANSETRON HCL 4 MG/2ML IJ SOLN: 4.0000 mg | Freq: Three times a day (TID) | INTRAMUSCULAR | Status: DC | PRN

## 2012-09-01 MED ORDER — FENTANYL CITRATE 0.05 MG/ML IJ SOLN
INTRAMUSCULAR | Status: DC | PRN
  Administered 2012-09-01: 87.5 ug via INTRAVENOUS

## 2012-09-01 MED ORDER — METOCLOPRAMIDE HCL 5 MG/ML IJ SOLN: 10.0000 mg | Freq: Three times a day (TID) | INTRAMUSCULAR | Status: DC | PRN

## 2012-09-01 MED ORDER — LANOLIN HYDROUS EX OINT: 1.0000 "application " | TOPICAL_OINTMENT | CUTANEOUS | Status: DC | PRN

## 2012-09-01 MED ORDER — DIPHENHYDRAMINE HCL 25 MG PO CAPS
25.0000 mg | ORAL_CAPSULE | ORAL | Status: DC | PRN
  Filled 2012-09-01: qty 1

## 2012-09-01 MED ORDER — OXYTOCIN 10 UNIT/ML IJ SOLN
40.0000 [IU] | INTRAVENOUS | Status: DC | PRN
  Administered 2012-09-01: 40 [IU] via INTRAVENOUS

## 2012-09-01 MED ORDER — KETOROLAC TROMETHAMINE 60 MG/2ML IM SOLN
INTRAMUSCULAR | Status: AC
  Administered 2012-09-01: 60 mg
  Filled 2012-09-01: qty 2

## 2012-09-01 MED ORDER — CEFAZOLIN SODIUM-DEXTROSE 2-3 GM-% IV SOLR
INTRAVENOUS | Status: AC
  Filled 2012-09-01: qty 50

## 2012-09-01 MED ORDER — SCOPOLAMINE 1 MG/3DAYS TD PT72
MEDICATED_PATCH | TRANSDERMAL | Status: AC
  Filled 2012-09-01: qty 1

## 2012-09-01 MED ORDER — NALOXONE HCL 1 MG/ML IJ SOLN
1.0000 ug/kg/h | INTRAVENOUS | Status: DC | PRN
  Filled 2012-09-01: qty 2

## 2012-09-01 MED ORDER — MAGNESIUM SULFATE 50 % IJ SOLN: 2.0000 g | INTRAVENOUS | Status: DC | PRN

## 2012-09-01 MED ORDER — FENTANYL CITRATE 0.05 MG/ML IJ SOLN
INTRAMUSCULAR | Status: AC
  Filled 2012-09-01: qty 2

## 2012-09-01 MED ORDER — SODIUM CHLORIDE 0.9 % IJ SOLN: 3.0000 mL | INTRAMUSCULAR | Status: DC | PRN

## 2012-09-01 MED ORDER — MENTHOL 3 MG MT LOZG: 1.0000 | LOZENGE | OROMUCOSAL | Status: DC | PRN

## 2012-09-01 MED ORDER — DIPHENHYDRAMINE HCL 50 MG/ML IJ SOLN: 25.0000 mg | INTRAMUSCULAR | Status: DC | PRN

## 2012-09-01 MED ORDER — EPHEDRINE SULFATE 50 MG/ML IJ SOLN
INTRAMUSCULAR | Status: DC | PRN
  Administered 2012-09-01 (×3): 10 mg via INTRAVENOUS

## 2012-09-01 MED ORDER — TETANUS-DIPHTH-ACELL PERTUSSIS 5-2.5-18.5 LF-MCG/0.5 IM SUSP: 0.5000 mL | Freq: Once | INTRAMUSCULAR | Status: DC

## 2012-09-01 MED ORDER — FENTANYL CITRATE 0.05 MG/ML IJ SOLN: 25.0000 ug | INTRAMUSCULAR | Status: DC | PRN

## 2012-09-01 MED ORDER — PROMETHAZINE HCL 25 MG/ML IJ SOLN: 6.2500 mg | INTRAMUSCULAR | Status: DC | PRN

## 2012-09-01 MED ORDER — DIBUCAINE 1 % RE OINT: 1.0000 "application " | TOPICAL_OINTMENT | RECTAL | Status: DC | PRN

## 2012-09-01 MED ORDER — DIPHENHYDRAMINE HCL 25 MG PO CAPS: 25.0000 mg | ORAL_CAPSULE | Freq: Four times a day (QID) | ORAL | Status: DC | PRN

## 2012-09-01 MED ORDER — 0.9 % SODIUM CHLORIDE (POUR BTL) OPTIME
TOPICAL | Status: DC | PRN
  Administered 2012-09-01: 1000 mL

## 2012-09-01 MED ORDER — FENTANYL CITRATE 0.05 MG/ML IJ SOLN
INTRAMUSCULAR | Status: DC | PRN
  Administered 2012-09-01: 12.5 ug via INTRATHECAL

## 2012-09-01 SURGICAL SUPPLY — 34 items
BENZOIN TINCTURE PRP APPL 2/3 (GAUZE/BANDAGES/DRESSINGS) ×3 IMPLANT
CLAMP CORD UMBIL (MISCELLANEOUS) IMPLANT
CLIP FILSHIE TUBAL LIGA STRL (Clip) ×3 IMPLANT
CLOTH BEACON ORANGE TIMEOUT ST (SAFETY) ×3 IMPLANT
DRAPE LG THREE QUARTER DISP (DRAPES) ×3 IMPLANT
DRSG OPSITE POSTOP 4X10 (GAUZE/BANDAGES/DRESSINGS) ×3 IMPLANT
DURAPREP 26ML APPLICATOR (WOUND CARE) ×3 IMPLANT
ELECT REM PT RETURN 9FT ADLT (ELECTROSURGICAL) ×3
ELECTRODE REM PT RTRN 9FT ADLT (ELECTROSURGICAL) ×2 IMPLANT
EXTRACTOR VACUUM KIWI (MISCELLANEOUS) IMPLANT
GLOVE BIO SURGEON ST LM GN SZ9 (GLOVE) ×3 IMPLANT
GLOVE BIOGEL PI IND STRL 9 (GLOVE) ×2 IMPLANT
GLOVE BIOGEL PI INDICATOR 9 (GLOVE) ×1
GOWN PREVENTION PLUS XLARGE (GOWN DISPOSABLE) ×3 IMPLANT
GOWN STRL REIN 3XL LVL4 (GOWN DISPOSABLE) ×3 IMPLANT
GOWN STRL REIN XL XLG (GOWN DISPOSABLE) ×6 IMPLANT
NEEDLE HYPO 25X5/8 SAFETYGLIDE (NEEDLE) IMPLANT
NS IRRIG 1000ML POUR BTL (IV SOLUTION) ×3 IMPLANT
PACK C SECTION WH (CUSTOM PROCEDURE TRAY) ×3 IMPLANT
PAD OB MATERNITY 4.3X12.25 (PERSONAL CARE ITEMS) ×3 IMPLANT
RETRACTOR WND ALEXIS 25 LRG (MISCELLANEOUS) IMPLANT
RTRCTR C-SECT PINK 25CM LRG (MISCELLANEOUS) IMPLANT
RTRCTR WOUND ALEXIS 25CM LRG (MISCELLANEOUS)
STRIP CLOSURE SKIN 1/2X4 (GAUZE/BANDAGES/DRESSINGS) ×3 IMPLANT
SUT CHROMIC 0 CTX 36 (SUTURE) ×6 IMPLANT
SUT VIC AB 0 CT1 27 (SUTURE) ×1
SUT VIC AB 0 CT1 27XBRD ANBCTR (SUTURE) ×2 IMPLANT
SUT VIC AB 2-0 CT1 27 (SUTURE) ×2
SUT VIC AB 2-0 CT1 TAPERPNT 27 (SUTURE) ×4 IMPLANT
SUT VIC AB 4-0 KS 27 (SUTURE) ×3 IMPLANT
SYR BULB IRRIGATION 50ML (SYRINGE) IMPLANT
TOWEL OR 17X24 6PK STRL BLUE (TOWEL DISPOSABLE) ×3 IMPLANT
TRAY FOLEY CATH 14FR (SET/KITS/TRAYS/PACK) ×3 IMPLANT
WATER STERILE IRR 1000ML POUR (IV SOLUTION) ×3 IMPLANT

## 2012-09-01 NOTE — Anesthesia Preprocedure Evaluation (Signed)
Anesthesia Evaluation  Patient identified by MRN, date of birth, ID band Patient awake    Reviewed: Allergy & Precautions, H&P , NPO status , Patient's Chart, lab work & pertinent test results  Airway Mallampati: II TM Distance: >3 FB Neck ROM: Full    Dental no notable dental hx.    Pulmonary neg pulmonary ROS,  breath sounds clear to auscultation  Pulmonary exam normal       Cardiovascular hypertension (PIH), Rhythm:Regular Rate:Normal     Neuro/Psych negative neurological ROS  negative psych ROS   GI/Hepatic negative GI ROS, Neg liver ROS,   Endo/Other  negative endocrine ROS  Renal/GU negative Renal ROS  negative genitourinary   Musculoskeletal negative musculoskeletal ROS (+)   Abdominal   Peds negative pediatric ROS (+)  Hematology negative hematology ROS (+)   Anesthesia Other Findings   Reproductive/Obstetrics negative OB ROS                           Anesthesia Physical Anesthesia Plan  ASA: II  Anesthesia Plan: Spinal   Post-op Pain Management:    Induction: Intravenous  Airway Management Planned:   Additional Equipment:   Intra-op Plan:   Post-operative Plan:   Informed Consent: I have reviewed the patients History and Physical, chart, labs and discussed the procedure including the risks, benefits and alternatives for the proposed anesthesia with the patient or authorized representative who has indicated his/her understanding and acceptance.   Dental advisory given  Plan Discussed with: CRNA  Anesthesia Plan Comments:         Anesthesia Quick Evaluation

## 2012-09-01 NOTE — Brief Op Note (Signed)
08/30/2012 - 09/01/2012  2:26 AM  PATIENT:  Katie Garrett  32 y.o. female  PRE-OPERATIVE DIAGNOSIS:  failure to progress, cephalopelvic disproportion Desire for perm sterilization   POST-OPERATIVE DIAGNOSIS:  failure to progress Cephalopelvic disproportion desire for perm sterilization PROCEDURE:  Procedure(s): CESAREAN SECTION WITH BILATERAL TUBAL LIGATION (Bilateral)  SURGEON:  Surgeon(s) and Role:    * Tilda Burrow, MD - Primary  PHYSICIAN ASSISTANT:   ASSISTANTS: none   ANESTHESIA:   spinal  EBL:  Total I/O In: 2539.9 [P.O.:440; I.V.:2099.9] Out: 2175 [Urine:1675; Blood:500]  BLOOD ADMINISTERED:none  DRAINS: none   LOCAL MEDICATIONS USED:  NONE  SPECIMEN:  Source of Specimen:  placenta to L&D  DISPOSITION OF SPECIMEN:  L&D  COUNTS:  YES  TOURNIQUET:  * No tourniquets in log *  DICTATION: .Dragon Dictation  PLAN OF CARE: Admit to inpatient   PATIENT DISPOSITION:  PACU - hemodynamically stable.   Delay start of Pharmacological VTE agent (>24hrs) due to surgical blood loss or risk of bleeding: not applicable

## 2012-09-01 NOTE — Op Note (Signed)
Preop pregnancy 37 weeks 2 days, cephalopelvic disproportion, failed TO LAC, mild preeclampsia Postop: Same, delivered Procedure: Repeat low transverse cervical cesarean section, bilateral tubal sterilization by Filshie clips Details: Patient was taken to the operating room prepped and draped after spinal anesthesia introduced Ancef administered, and timeout conducted. Prior transverse incision was repeated, and the abdominal cavity opened. Omental adhesions to the old bladder flap were sharply incised bladder flap was developed. Transverse uterine incision was made on the lower uterine segment. Fetal vertex was floating out of the pelvis still. Amniotic fluid was clear. The fetal vertex delivered easily and the infant was passed to waiting pediatrician. There was a nuchal cord x1, body cord x1 and a true knot in cord. She pediatrician's notes regarding the baby who had Apgars of 57 and 8. Cord blood pH was obtained later in the procedure, 7.3. Placenta delivered easily intact Tomasa Blase presentation and uterus was irrigated with saline solution, closed in a running locking 0 chromic first layer followed by a continuous running second layer also 0 chromic. Hemostasis was good. Some omental adhesions required point cautery were then transected. Anterior peritoneum was closed using running 2-0 Vicryl, the fascia closed running 0 Vicryl, the subcutaneous tissues approximated using interrupted 2-0 Vicryl. Subcuticular skin closure with 4-0 Vicryl using a Mellody Dance needle. Sponge and needle counts correct patient to recovery room in stable condition she will go to recovery room then ICU for 24 hours of magnesium sulfate  Prophylaxis.

## 2012-09-01 NOTE — Anesthesia Postprocedure Evaluation (Signed)
  Anesthesia Post-op Note  Patient: Katie Garrett  Procedure(s) Performed: Procedure(s): CESAREAN SECTION WITH BILATERAL TUBAL LIGATION (Bilateral)  Patient Location: PACU and A-ICU  Anesthesia Type:Spinal  Level of Consciousness: awake, alert  and oriented  Airway and Oxygen Therapy: Patient Spontanous Breathing  Post-op Pain: mild  Post-op Assessment: Patient's Cardiovascular Status Stable, Respiratory Function Stable, No signs of Nausea or vomiting, Adequate PO intake and Pain level controlled  Post-op Vital Signs: stable  Complications: No apparent anesthesia complications

## 2012-09-01 NOTE — Anesthesia Procedure Notes (Signed)
Spinal  Patient location during procedure: OR Staffing Anesthesiologist: Phillips Grout Performed by: anesthesiologist  Preanesthetic Checklist Completed: patient identified, site marked, surgical consent, pre-op evaluation, timeout performed, IV checked, risks and benefits discussed and monitors and equipment checked Spinal Block Patient position: sitting Prep: Betadine Patient monitoring: heart rate, continuous pulse ox and blood pressure Approach: midline Location: L4-5 Injection technique: single-shot Needle Needle type: Sprotte  Needle gauge: 24 G Needle length: 9 cm Assessment Sensory level: T4 Additional Notes Expiration date of kit checked and confirmed. Patient tolerated procedure well, without complications.

## 2012-09-01 NOTE — Transfer of Care (Signed)
Immediate Anesthesia Transfer of Care Note  Patient: Katie Garrett  Procedure(s) Performed: Procedure(s): CESAREAN SECTION WITH BILATERAL TUBAL LIGATION (Bilateral)  Patient Location: PACU  Anesthesia Type:Spinal  Level of Consciousness: awake  Airway & Oxygen Therapy: Patient Spontanous Breathing  Post-op Assessment: Report given to PACU RN and Post -op Vital signs reviewed and stable  Post vital signs: stable  Complications: No apparent anesthesia complications

## 2012-09-01 NOTE — Progress Notes (Signed)
Iv info charted in error on wrong pt.

## 2012-09-01 NOTE — Progress Notes (Signed)
Unsuccessful attempts x8

## 2012-09-01 NOTE — Anesthesia Postprocedure Evaluation (Signed)
  Anesthesia Post-op Note  Patient: Katie Garrett  Procedure(s) Performed: Procedure(s) (LRB): CESAREAN SECTION WITH BILATERAL TUBAL LIGATION (Bilateral)  Patient Location: PACU  Anesthesia Type: Spinal  Level of Consciousness: awake and alert   Airway and Oxygen Therapy: Patient Spontanous Breathing  Post-op Pain: mild  Post-op Assessment: Post-op Vital signs reviewed, Patient's Cardiovascular Status Stable, Respiratory Function Stable, Patent Airway and No signs of Nausea or vomiting  Last Vitals:  Filed Vitals:   09/01/12 0245  BP: 129/62  Pulse: 101  Temp:   Resp: 15    Post-op Vital Signs: stable   Complications: No apparent anesthesia complications

## 2012-09-02 MED ORDER — FERROUS SULFATE 325 (65 FE) MG PO TABS
325.0000 mg | ORAL_TABLET | Freq: Two times a day (BID) | ORAL | Status: DC
Start: 2012-09-02 — End: 2012-09-02
  Filled 2012-09-02: qty 1

## 2012-09-02 MED ORDER — OXYCODONE-ACETAMINOPHEN 5-325 MG/5ML PO SOLN
5.0000 mL | ORAL | Status: DC | PRN
  Administered 2012-09-03 (×2): 10 mL via ORAL
  Filled 2012-09-02 (×2): qty 10

## 2012-09-02 MED ORDER — COMPLETENATE 29-1 MG PO CHEW
1.0000 | CHEWABLE_TABLET | Freq: Every day | ORAL | Status: DC
  Filled 2012-09-02 (×2): qty 1

## 2012-09-02 MED ORDER — IBUPROFEN 100 MG/5ML PO SUSP
600.0000 mg | Freq: Four times a day (QID) | ORAL | Status: DC
  Administered 2012-09-02: 100 mg via ORAL
  Filled 2012-09-02 (×5): qty 30

## 2012-09-02 MED ORDER — FERROUS SULFATE 300 (60 FE) MG/5ML PO SYRP
300.0000 mg | ORAL_SOLUTION | Freq: Two times a day (BID) | ORAL | Status: DC
  Filled 2012-09-02 (×2): qty 5

## 2012-09-02 NOTE — Progress Notes (Signed)
Subjective: Postpartum Day 1: Cesarean Delivery/BTL Patient reports incisional pain, tolerating PO and + flatus, foley still in. Has been out of bed.  Breastfeeding with no problems. Had mild headache last night but none now.  Objective: Vital signs in last 24 hours: Temp:  [97.4 F (36.3 C)-98.3 F (36.8 C)] 98.1 F (36.7 C) (06/01 0400) Pulse Rate:  [71-96] 78 (06/01 0700) Resp:  [16-20] 20 (06/01 0700) BP: (117-151)/(69-93) 131/75 mmHg (06/01 0700) SpO2:  [94 %-100 %] 100 % (05/31 1900) Weight:  [79.379 kg (175 lb)] 79.379 kg (175 lb) (06/01 0600)  Filed Vitals:   09/02/12 0400 09/02/12 0500 09/02/12 0600 09/02/12 0700  BP: 142/93 142/81 143/89 131/75  Pulse:    78  Temp: 98.1 F (36.7 C)     TempSrc: Oral     Resp: 20 20 20 20   Height:      Weight:   79.379 kg (175 lb)   SpO2:         Physical Exam:  General: alert, cooperative and no distress Lochia: appropriate Uterine Fundus: firm Incision: no significant drainage DVT Evaluation: No evidence of DVT seen on physical exam. Negative Homan's sign. No cords or calf tenderness. 1+ edema of feet, ankles, calves   Recent Labs  08/31/12 2330 09/01/12 0720  HGB 10.7* 9.8*  HCT 33.3* 30.7*    Assessment/Plan: Status post Cesarean section. Doing well postoperatively. Preeclampsia - BP controlled off mag  Continue current care Breastfeeding Transfer to floor, likely discharge tomorrow or Tuesday  Napoleon Form 09/02/2012, 7:24 AM

## 2012-09-03 ENCOUNTER — Encounter (HOSPITAL_COMMUNITY): Payer: Self-pay | Admitting: Obstetrics and Gynecology

## 2012-09-03 LAB — TYPE AND SCREEN
ABO/RH(D): O POS
Antibody Screen: NEGATIVE
Unit division: 0
Unit division: 0

## 2012-09-03 MED ORDER — HYDROCHLOROTHIAZIDE 12.5 MG PO TABS: 12.5000 mg | ORAL_TABLET | Freq: Every day | ORAL | Status: DC

## 2012-09-03 MED ORDER — HYDROCHLOROTHIAZIDE 25 MG PO TABS
12.5000 mg | ORAL_TABLET | Freq: Every day | ORAL | Status: DC
  Filled 2012-09-03: qty 0.5

## 2012-09-03 MED ORDER — HYDROCHLOROTHIAZIDE 12.5 MG PO CAPS
12.5000 mg | ORAL_CAPSULE | Freq: Every day | ORAL | Status: DC
  Administered 2012-09-03: 12.5 mg via ORAL
  Filled 2012-09-03: qty 1

## 2012-09-03 MED ORDER — OXYCODONE-ACETAMINOPHEN 5-325 MG/5ML PO SOLN: 5.0000 mL | ORAL | Status: DC | PRN

## 2012-09-03 MED ORDER — IBUPROFEN 100 MG/5ML PO SUSP: 600.0000 mg | Freq: Four times a day (QID) | ORAL | Status: DC

## 2012-09-03 MED ORDER — FERROUS SULFATE 300 (60 FE) MG/5ML PO SYRP: 300.0000 mg | ORAL_SOLUTION | Freq: Two times a day (BID) | ORAL | Status: DC

## 2012-09-03 MED ORDER — SENNOSIDES-DOCUSATE SODIUM 8.6-50 MG PO TABS: 2.0000 | ORAL_TABLET | Freq: Every day | ORAL | Status: DC

## 2012-09-03 NOTE — Progress Notes (Signed)
UR chart review completed.  

## 2012-09-03 NOTE — Discharge Summary (Signed)
Obstetric Discharge Summary Katie Garrett is a 32 y.o. [id number] presenting at [redacted]w[redacted]d with preeclampsia. She had had a prior cesarean delivery for non-reassuring fetal heart tones at 10 cm and was planning TOLAC. She had a foley bulb and low dose pitocin for over 24 hours and failed to progress past 1 cm. She was taken for cesarean section with BTS for failed TOLAC/failed IOL. She was also treated with magnesium for preeclampsia. Her post-op course was unremarkable. She continued magnesium for 24 hours with good diuresis. Blood pressures were in the 140-150s/80-90s after magnesium was turned off. She was started on HCTZ 12.5 mg. She was breastfeeding and had BTS for contraception.  Reason for Admission: induction of labor for preeclampsia Prenatal Procedures: NST and Preeclampsia Intrapartum Procedures: cesarean: low cervical, transverse and tubal ligation Postpartum Procedures: none Complications-Operative and Postpartum: none Hemoglobin  Date Value Range Status  09/01/2012 9.8* 12.0 - 15.0 g/dL Final     HCT  Date Value Range Status  09/01/2012 30.7* 36.0 - 46.0 % Final    Physical Exam:  General: alert, cooperative and no distress Lochia: appropriate Uterine Fundus: firm Incision: healing well, no significant drainage, no dehiscence, no significant erythema DVT Evaluation: No evidence of DVT seen on physical exam. Negative Homan's sign. No cords or calf tenderness. Calf/Ankle edema is present.  Discharge Diagnoses: Term Pregnancy-delivered, Failed induction, Preelampsia and failed TOLAC, s/p cesarean section and BTL  Discharge Information: Date: 09/03/2012 Activity: pelvic rest Diet: routine Medications: PNV, Ibuprofen, Colace, Iron and Percocet Condition: stable Instructions: refer to practice specific booklet Discharge to: home Follow-up Information   Follow up with [facility] [place] In 5 weeks.   Contact information:   [address] [place]  [address] [phone]      Newborn Data: Live born female  Birth Weight: 5 lb 13.3 oz (2645 g) APGAR: 5, 7  Home with mother.  Napoleon Form 09/03/2012, 7:30 AM

## 2012-09-07 NOTE — Discharge Summary (Signed)
Attestation of Attending Supervision of Advanced Practitioner: Evaluation and management procedures were performed by the PA/NP/CNM/OB Fellow under my supervision/collaboration. Chart reviewed and agree with management and plan.  Adra Shepler V 09/07/2012 11:26 PM

## 2014-02-03 ENCOUNTER — Encounter (HOSPITAL_COMMUNITY): Payer: Self-pay | Admitting: Obstetrics and Gynecology

## 2014-02-14 ENCOUNTER — Emergency Department (HOSPITAL_COMMUNITY)
Admission: EM | Admit: 2014-02-14 | Discharge: 2014-02-14 | Disposition: A | Discharge status: Home / Self Care | Payer: Medicaid Other | Attending: Emergency Medicine | Admitting: Emergency Medicine

## 2014-02-14 ENCOUNTER — Encounter (HOSPITAL_COMMUNITY): Payer: Self-pay | Admitting: Emergency Medicine

## 2014-02-14 DIAGNOSIS — Z8619 Personal history of other infectious and parasitic diseases: Secondary | ICD-10-CM | POA: Insufficient documentation

## 2014-02-14 DIAGNOSIS — Y92410 Unspecified street and highway as the place of occurrence of the external cause: Secondary | ICD-10-CM | POA: Diagnosis not present

## 2014-02-14 DIAGNOSIS — I10 Essential (primary) hypertension: Secondary | ICD-10-CM | POA: Insufficient documentation

## 2014-02-14 DIAGNOSIS — R51 Headache: Secondary | ICD-10-CM | POA: Diagnosis not present

## 2014-02-14 DIAGNOSIS — Z87891 Personal history of nicotine dependence: Secondary | ICD-10-CM | POA: Diagnosis not present

## 2014-02-14 DIAGNOSIS — Y998 Other external cause status: Secondary | ICD-10-CM | POA: Diagnosis not present

## 2014-02-14 DIAGNOSIS — S199XXA Unspecified injury of neck, initial encounter: Secondary | ICD-10-CM | POA: Diagnosis not present

## 2014-02-14 DIAGNOSIS — R110 Nausea: Secondary | ICD-10-CM | POA: Diagnosis not present

## 2014-02-14 DIAGNOSIS — Z79899 Other long term (current) drug therapy: Secondary | ICD-10-CM | POA: Insufficient documentation

## 2014-02-14 DIAGNOSIS — S3992XA Unspecified injury of lower back, initial encounter: Secondary | ICD-10-CM | POA: Insufficient documentation

## 2014-02-14 DIAGNOSIS — Y9389 Activity, other specified: Secondary | ICD-10-CM | POA: Diagnosis not present

## 2014-02-14 MED ORDER — NAPROXEN 500 MG PO TABS: 500.0000 mg | ORAL_TABLET | Freq: Two times a day (BID) | ORAL | Status: DC

## 2014-02-14 MED ORDER — CYCLOBENZAPRINE HCL 10 MG PO TABS: 10.0000 mg | ORAL_TABLET | Freq: Two times a day (BID) | ORAL | Status: DC | PRN
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
## 2014-02-14 NOTE — Discharge Instructions (Signed)
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
## 2014-02-16 ENCOUNTER — Emergency Department (HOSPITAL_COMMUNITY)
Admission: EM | Admit: 2014-02-16 | Discharge: 2014-02-16 | Disposition: A | Discharge status: Home / Self Care | Payer: Medicaid Other | Attending: Emergency Medicine | Admitting: Emergency Medicine

## 2014-02-16 ENCOUNTER — Encounter (HOSPITAL_COMMUNITY): Payer: Self-pay

## 2014-02-16 ENCOUNTER — Emergency Department (HOSPITAL_COMMUNITY): Payer: Medicaid Other

## 2014-02-16 DIAGNOSIS — Z79899 Other long term (current) drug therapy: Secondary | ICD-10-CM | POA: Diagnosis not present

## 2014-02-16 DIAGNOSIS — Y92410 Unspecified street and highway as the place of occurrence of the external cause: Secondary | ICD-10-CM | POA: Insufficient documentation

## 2014-02-16 DIAGNOSIS — S3991XA Unspecified injury of abdomen, initial encounter: Secondary | ICD-10-CM | POA: Diagnosis not present

## 2014-02-16 DIAGNOSIS — S3992XA Unspecified injury of lower back, initial encounter: Secondary | ICD-10-CM | POA: Diagnosis not present

## 2014-02-16 DIAGNOSIS — Z87891 Personal history of nicotine dependence: Secondary | ICD-10-CM | POA: Diagnosis not present

## 2014-02-16 DIAGNOSIS — Z8619 Personal history of other infectious and parasitic diseases: Secondary | ICD-10-CM | POA: Diagnosis not present

## 2014-02-16 DIAGNOSIS — R112 Nausea with vomiting, unspecified: Secondary | ICD-10-CM | POA: Diagnosis not present

## 2014-02-16 DIAGNOSIS — Z3202 Encounter for pregnancy test, result negative: Secondary | ICD-10-CM | POA: Diagnosis not present

## 2014-02-16 DIAGNOSIS — Z791 Long term (current) use of non-steroidal anti-inflammatories (NSAID): Secondary | ICD-10-CM | POA: Insufficient documentation

## 2014-02-16 DIAGNOSIS — Y9389 Activity, other specified: Secondary | ICD-10-CM | POA: Insufficient documentation

## 2014-02-16 DIAGNOSIS — Y998 Other external cause status: Secondary | ICD-10-CM | POA: Insufficient documentation

## 2014-02-16 DIAGNOSIS — R109 Unspecified abdominal pain: Secondary | ICD-10-CM

## 2014-02-16 LAB — URINALYSIS, ROUTINE W REFLEX MICROSCOPIC
BILIRUBIN URINE: NEGATIVE
GLUCOSE, UA: NEGATIVE mg/dL
KETONES UR: NEGATIVE mg/dL
NITRITE: NEGATIVE
PH: 7.5 (ref 5.0–8.0)
Protein, ur: NEGATIVE mg/dL
SPECIFIC GRAVITY, URINE: 1.021 (ref 1.005–1.030)
Urobilinogen, UA: 0.2 mg/dL (ref 0.0–1.0)

## 2014-02-16 LAB — CBC WITH DIFFERENTIAL/PLATELET
BASOS ABS: 0 10*3/uL (ref 0.0–0.1)
BASOS PCT: 0 % (ref 0–1)
Eosinophils Absolute: 0.2 10*3/uL (ref 0.0–0.7)
Eosinophils Relative: 3 % (ref 0–5)
HEMATOCRIT: 38.4 % (ref 36.0–46.0)
Hemoglobin: 12.4 g/dL (ref 12.0–15.0)
LYMPHS PCT: 37 % (ref 12–46)
Lymphs Abs: 2.8 10*3/uL (ref 0.7–4.0)
MCH: 25.7 pg — ABNORMAL LOW (ref 26.0–34.0)
MCHC: 32.3 g/dL (ref 30.0–36.0)
MCV: 79.7 fL (ref 78.0–100.0)
Monocytes Absolute: 0.3 10*3/uL (ref 0.1–1.0)
Monocytes Relative: 4 % (ref 3–12)
NEUTROS ABS: 4.3 10*3/uL (ref 1.7–7.7)
NEUTROS PCT: 56 % (ref 43–77)
PLATELETS: 301 10*3/uL (ref 150–400)
RBC: 4.82 MIL/uL (ref 3.87–5.11)
RDW: 14.6 % (ref 11.5–15.5)
WBC: 7.7 10*3/uL (ref 4.0–10.5)

## 2014-02-16 LAB — URINE MICROSCOPIC-ADD ON

## 2014-02-16 LAB — POC URINE PREG, ED: PREG TEST UR: NEGATIVE

## 2014-02-16 LAB — COMPREHENSIVE METABOLIC PANEL
ALBUMIN: 3.5 g/dL (ref 3.5–5.2)
ALT: 6 U/L (ref 0–35)
AST: 13 U/L (ref 0–37)
Alkaline Phosphatase: 83 U/L (ref 39–117)
Anion gap: 11 (ref 5–15)
BILIRUBIN TOTAL: 0.2 mg/dL — AB (ref 0.3–1.2)
BUN: 10 mg/dL (ref 6–23)
CHLORIDE: 102 meq/L (ref 96–112)
CO2: 24 meq/L (ref 19–32)
CREATININE: 0.87 mg/dL (ref 0.50–1.10)
Calcium: 8.8 mg/dL (ref 8.4–10.5)
GFR calc Af Amer: >90 mL/min (ref >90)
GFR, EST NON AFRICAN AMERICAN: 87 mL/min — AB (ref >90)
Glucose, Bld: 115 mg/dL — ABNORMAL HIGH (ref 70–99)
POTASSIUM: 3.4 meq/L — AB (ref 3.7–5.3)
SODIUM: 137 meq/L (ref 137–147)
Total Protein: 7.6 g/dL (ref 6.0–8.3)

## 2014-02-16 LAB — LIPASE, BLOOD: Lipase: 21 U/L (ref 11–59)

## 2014-02-16 MED ORDER — SODIUM CHLORIDE 0.9 % IV BOLUS (SEPSIS)
1000.0000 mL | Freq: Once | INTRAVENOUS | Status: AC
Start: 2014-02-16 — End: 2014-02-16
  Administered 2014-02-16: 1000 mL via INTRAVENOUS

## 2014-02-16 MED ORDER — TRAMADOL HCL 50 MG PO TABS: 50.0000 mg | ORAL_TABLET | Freq: Four times a day (QID) | ORAL | Status: DC | PRN

## 2014-02-16 MED ORDER — MORPHINE SULFATE 4 MG/ML IJ SOLN
4.0000 mg | Freq: Once | INTRAMUSCULAR | Status: AC
  Administered 2014-02-16: 4 mg via INTRAVENOUS
  Filled 2014-02-16: qty 1

## 2014-02-16 MED ORDER — ONDANSETRON HCL 4 MG/2ML IJ SOLN
4.0000 mg | Freq: Once | INTRAMUSCULAR | Status: AC
  Administered 2014-02-16: 4 mg via INTRAVENOUS
  Filled 2014-02-16: qty 2

## 2014-02-16 MED ORDER — IOHEXOL 300 MG/ML  SOLN
100.0000 mL | Freq: Once | INTRAMUSCULAR | Status: AC | PRN
  Administered 2014-02-16: 100 mL via INTRAVENOUS

## 2014-02-16 MED ORDER — PROMETHAZINE HCL 25 MG PO TABS: 25.0000 mg | ORAL_TABLET | Freq: Four times a day (QID) | ORAL | Status: DC | PRN

## 2014-02-16 NOTE — ED Notes (Signed)
Patient transported to CT 

## 2014-02-16 NOTE — ED Notes (Signed)
Offered pt the restroom in order to get a sample. Pt had used the bathroom in [place] and was unaware that a sample was needed. Pt will let us know when she is ready to provide a sample.

## 2014-02-16 NOTE — Discharge Instructions (Signed)
Abdominal Pain, Women Abdominal (stomach, pelvic, or belly) pain can be caused by many things. It is important to tell your doctor:  The location of the pain.  Does it come and go or is it present all the time?  Are there things that start the pain (eating certain foods, exercise)?  Are there other symptoms associated with the pain (fever, nausea, vomiting, diarrhea)? All of this is helpful to know when trying to find the cause of the pain. CAUSES   Stomach: virus or bacteria infection, or ulcer.  Intestine: appendicitis (inflamed appendix), regional ileitis (Crohn's disease), ulcerative colitis (inflamed colon), irritable bowel syndrome, diverticulitis (inflamed diverticulum of the colon), or cancer of the stomach or intestine.  Gallbladder disease or stones in the gallbladder.  Kidney disease, kidney stones, or infection.  Pancreas infection or cancer.  Fibromyalgia (pain disorder).  Diseases of the female organs:  Uterus: fibroid (non-cancerous) tumors or infection.  Fallopian tubes: infection or tubal pregnancy.  Ovary: cysts or tumors.  Pelvic adhesions (scar tissue).  Endometriosis (uterus lining tissue growing in the pelvis and on the pelvic organs).  Pelvic congestion syndrome (female organs filling up with blood just before the menstrual period).  Pain with the menstrual period.  Pain with ovulation (producing an egg).  Pain with an IUD (intrauterine device, birth control) in the uterus.  Cancer of the female organs.  Functional pain (pain not caused by a disease, may improve without treatment).  Psychological pain.  Depression. DIAGNOSIS  Your doctor will decide the seriousness of your pain by doing an examination.  Blood tests.  X-rays.  Ultrasound.  CT scan (computed tomography, special type of X-ray).  MRI (magnetic resonance imaging).  Cultures, for infection.  Barium enema (dye inserted in the large intestine, to better view it with  X-rays).  Colonoscopy (looking in intestine with a lighted tube).  Laparoscopy (minor surgery, looking in abdomen with a lighted tube).  Major abdominal exploratory surgery (looking in abdomen with a large incision). TREATMENT  The treatment will depend on the cause of the pain.   Many cases can be observed and treated at home.  Over-the-counter medicines recommended by your caregiver.  Prescription medicine.  Antibiotics, for infection.  Birth control pills, for painful periods or for ovulation pain.  Hormone treatment, for endometriosis.  Nerve blocking injections.  Physical therapy.  Antidepressants.  Counseling with a psychologist or psychiatrist.  Minor or major surgery. HOME CARE INSTRUCTIONS   Do not take laxatives, unless directed by your caregiver.  Take over-the-counter pain medicine only if ordered by your caregiver. Do not take aspirin because it can cause an upset stomach or bleeding.  Try a clear liquid diet (broth or water) as ordered by your caregiver. Slowly move to a bland diet, as tolerated, if the pain is related to the stomach or intestine.  Have a thermometer and take your temperature several times a day, and record it.  Bed rest and sleep, if it helps the pain.  Avoid sexual intercourse, if it causes pain.  Avoid stressful situations.  Keep your follow-up appointments and tests, as your caregiver orders.  If the pain does not go away with medicine or surgery, you may try:  Acupuncture.  Relaxation exercises (yoga, meditation).  Group therapy.  Counseling. SEEK MEDICAL CARE IF:   You notice certain foods cause stomach pain.  Your home care treatment is not helping your pain.  You need stronger pain medicine.  You want your IUD removed.  You feel faint or  lightheaded. °· You develop nausea and vomiting. °· You develop a rash. °· You are having side effects or an allergy to your medicine. °SEEK IMMEDIATE MEDICAL CARE IF:  °· Your  pain does not go away or gets worse. °· You have a fever. °· Your pain is felt only in portions of the abdomen. The right side could possibly be appendicitis. The left lower portion of the abdomen could be colitis or diverticulitis. °· You are passing blood in your stools (bright red or black tarry stools, with or without vomiting). °· You have blood in your urine. °· You develop chills, with or without a fever. °· You pass out. °MAKE SURE YOU:  °· Understand these instructions. °· Will watch your condition. °· Will get help right away if you are not doing well or get worse. °Document Released: 01/16/2007 Document Revised: 08/05/2013 Document Reviewed: 02/05/2009 °ExitCare® Patient Information ©2015 [employer]. This information is not intended to replace advice given to you by your health care provider. Make sure you discuss any questions you have with your health care provider. °Motor Vehicle Collision °It is common to have multiple bruises and sore muscles after a motor vehicle collision (MVC). These tend to feel worse for the first 24 hours. You may have the most stiffness and soreness over the first several hours. You may also feel worse when you wake up the first morning after your collision. After this point, you will usually begin to improve with each day. The speed of improvement often depends on the severity of the collision, the number of injuries, and the location and nature of these injuries. °HOME CARE INSTRUCTIONS °· Put ice on the injured area. °¨ Put ice in a plastic bag. °¨ Place a towel between your skin and the bag. °¨ Leave the ice on for 15-20 minutes, 3-4 times a day, or as directed by your health care provider. °· Drink enough fluids to keep your urine clear or pale yellow. Do not drink alcohol. °· Take a warm shower or bath once or twice a day. This will increase blood flow to sore muscles. °· You may return to activities as directed by your caregiver. Be careful when lifting, as this may  aggravate neck or back pain. °· Only take over-the-counter or prescription medicines for pain, discomfort, or fever as directed by your caregiver. Do not use aspirin. This may increase bruising and bleeding. °SEEK IMMEDIATE MEDICAL CARE IF: °· You have numbness, tingling, or weakness in the arms or legs. °· You develop severe headaches not relieved with medicine. °· You have severe neck pain, especially tenderness in the middle of the back of your neck. °· You have changes in bowel or bladder control. °· There is increasing pain in any area of the body. °· You have shortness of breath, light-headedness, dizziness, or fainting. °· You have chest pain. °· You feel sick to your stomach (nauseous), throw up (vomit), or sweat. °· You have increasing abdominal discomfort. °· There is blood in your urine, stool, or vomit. °· You have pain in your shoulder (shoulder strap areas). °· You feel your symptoms are getting worse. °MAKE SURE YOU: °· Understand these instructions. °· Will watch your condition. °· Will get help right away if you are not doing well or get worse. °Document Released: 03/21/2005 Document Revised: 08/05/2013 Document Reviewed: 08/18/2010 °ExitCare® Patient Information ©2015 [employer]. This information is not intended to replace advice given to you by your health care provider. Make sure you discuss   discuss any questions you have with your health care provider. ° °
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
## 2014-02-16 NOTE — ED Notes (Addendum)
Pt. Refused in-out cath for collection of clean urine. Jarrett Soho, [place] made aware.

## 2014-02-16 NOTE — ED Notes (Signed)
C/o abd and back pain, rates 6/10, also nv, last emesis 1 hr ago, last BM yesterday (normal), last ate 2130. (denies: fever, diarrhea, bleeding, d/c, dizziness or sob).
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
## 2014-06-30 ENCOUNTER — Encounter: Payer: Self-pay | Admitting: *Deleted

## 2014-07-07 ENCOUNTER — Encounter (HOSPITAL_COMMUNITY): Payer: Self-pay | Admitting: Emergency Medicine

## 2014-07-07 DIAGNOSIS — R112 Nausea with vomiting, unspecified: Secondary | ICD-10-CM | POA: Diagnosis not present

## 2014-07-07 DIAGNOSIS — Z8619 Personal history of other infectious and parasitic diseases: Secondary | ICD-10-CM | POA: Insufficient documentation

## 2014-07-07 DIAGNOSIS — R51 Headache: Secondary | ICD-10-CM | POA: Insufficient documentation

## 2014-07-07 DIAGNOSIS — Z3202 Encounter for pregnancy test, result negative: Secondary | ICD-10-CM | POA: Diagnosis not present

## 2014-07-07 DIAGNOSIS — Z79899 Other long term (current) drug therapy: Secondary | ICD-10-CM | POA: Insufficient documentation

## 2014-07-07 DIAGNOSIS — H538 Other visual disturbances: Secondary | ICD-10-CM | POA: Diagnosis not present

## 2014-07-07 DIAGNOSIS — Z87891 Personal history of nicotine dependence: Secondary | ICD-10-CM | POA: Diagnosis not present

## 2014-07-07 DIAGNOSIS — Z791 Long term (current) use of non-steroidal anti-inflammatories (NSAID): Secondary | ICD-10-CM | POA: Diagnosis not present

## 2014-07-07 LAB — I-STAT CHEM 8, ED
BUN: 15 mg/dL (ref 6–23)
Calcium, Ion: 1.29 mmol/L — ABNORMAL HIGH (ref 1.12–1.23)
Chloride: 103 mmol/L (ref 96–112)
Creatinine, Ser: 0.8 mg/dL (ref 0.50–1.10)
Glucose, Bld: 102 mg/dL — ABNORMAL HIGH (ref 70–99)
HEMATOCRIT: 39 % (ref 36.0–46.0)
Hemoglobin: 13.3 g/dL (ref 12.0–15.0)
Potassium: 3.8 mmol/L (ref 3.5–5.1)
Sodium: 144 mmol/L (ref 135–145)
TCO2: 23 mmol/L (ref 0–100)

## 2014-07-07 LAB — CBC WITH DIFFERENTIAL/PLATELET
Basophils Absolute: 0 10*3/uL (ref 0.0–0.1)
Basophils Relative: 0 % (ref 0–1)
EOS ABS: 0.2 10*3/uL (ref 0.0–0.7)
Eosinophils Relative: 3 % (ref 0–5)
HCT: 35.7 % — ABNORMAL LOW (ref 36.0–46.0)
HEMOGLOBIN: 11.5 g/dL — AB (ref 12.0–15.0)
LYMPHS PCT: 38 % (ref 12–46)
Lymphs Abs: 3 10*3/uL (ref 0.7–4.0)
MCH: 25.1 pg — AB (ref 26.0–34.0)
MCHC: 32.2 g/dL (ref 30.0–36.0)
MCV: 77.8 fL — ABNORMAL LOW (ref 78.0–100.0)
MONOS PCT: 5 % (ref 3–12)
Monocytes Absolute: 0.4 10*3/uL (ref 0.1–1.0)
Neutro Abs: 4.1 10*3/uL (ref 1.7–7.7)
Neutrophils Relative %: 54 % (ref 43–77)
Platelets: 276 10*3/uL (ref 150–400)
RBC: 4.59 MIL/uL (ref 3.87–5.11)
RDW: 15 % (ref 11.5–15.5)
WBC: 7.7 10*3/uL (ref 4.0–10.5)

## 2014-07-07 NOTE — ED Notes (Signed)
Pt c/o headache x's 2 days.  St's she has vomited x's 3 today with blood in emesis

## 2014-07-08 ENCOUNTER — Emergency Department (HOSPITAL_COMMUNITY)
Admission: EM | Admit: 2014-07-08 | Discharge: 2014-07-08 | Disposition: A | Discharge status: Home / Self Care | Payer: Medicaid Other | Attending: Emergency Medicine | Admitting: Emergency Medicine

## 2014-07-08 DIAGNOSIS — R51 Headache: Secondary | ICD-10-CM

## 2014-07-08 DIAGNOSIS — R519 Headache, unspecified: Secondary | ICD-10-CM

## 2014-07-08 LAB — I-STAT BETA HCG BLOOD, ED (MC, WL, AP ONLY)

## 2014-07-08 MED ORDER — METOCLOPRAMIDE HCL 5 MG/ML IJ SOLN
10.0000 mg | Freq: Once | INTRAMUSCULAR | Status: AC
  Administered 2014-07-08: 10 mg via INTRAMUSCULAR
  Filled 2014-07-08: qty 2

## 2014-07-08 MED ORDER — DIPHENHYDRAMINE HCL 50 MG/ML IJ SOLN
25.0000 mg | Freq: Once | INTRAMUSCULAR | Status: AC
  Administered 2014-07-08: 25 mg via INTRAMUSCULAR
  Filled 2014-07-08: qty 1

## 2014-07-08 NOTE — ED Notes (Signed)
Pt. Left with all belongings and refused wheelchair 

## 2014-07-08 NOTE — Discharge Instructions (Signed)
Please follow up with your primary care physician in 1-2 days. If you do not have one please call the [place] number listed above. Please read all discharge instructions and return precautions.   General Headache Without Cause A headache is pain or discomfort felt around the head or neck area. The specific cause of a headache may not be found. There are many causes and types of headaches. A few common ones are:  Tension headaches.  Migraine headaches.  Cluster headaches.  Chronic daily headaches. HOME CARE INSTRUCTIONS   Keep all follow-up appointments with your caregiver or any specialist referral.  Only take over-the-counter or prescription medicines for pain or discomfort as directed by your caregiver.  Lie down in a dark, quiet room when you have a headache.  Keep a headache journal to find out what may trigger your migraine headaches. For example, write down:  What you eat and drink.  How much sleep you get.  Any change to your diet or medicines.  Try massage or other relaxation techniques.  Put ice packs or heat on the head and neck. Use these 3 to 4 times per day for 15 to 20 minutes each time, or as needed.  Limit stress.  Sit up straight, and do not tense your muscles.  Quit smoking if you smoke.  Limit alcohol use.  Decrease the amount of caffeine you drink, or stop drinking caffeine.  Eat and sleep on a regular schedule.  Get 7 to 9 hours of sleep, or as recommended by your caregiver.  Keep lights dim if bright lights bother you and make your headaches worse. SEEK MEDICAL CARE IF:   You have problems with the medicines you were prescribed.  Your medicines are not working.  You have a change from the usual headache.  You have nausea or vomiting. SEEK IMMEDIATE MEDICAL CARE IF:   Your headache becomes severe.  You have a fever.  You have a stiff neck.  You have loss of vision.  You have muscular weakness or loss of  muscle control.  You start losing your balance or have trouble walking.  You feel faint or pass out.  You have severe symptoms that are different from your first symptoms. MAKE SURE YOU:   Understand these instructions.  Will watch your condition.  Will get help right away if you are not doing well or get worse. Document Released: 03/21/2005 Document Revised: 06/13/2011 Document Reviewed: 04/06/2011 [facility] Patient Information 2015 [place], [place]. This information is not intended to replace advice given to you by your health care provider. Make sure you discuss any questions you have with your health care provider.

## 2014-07-08 NOTE — ED Provider Notes (Signed)
CSN: [id number]     Arrival date & time 07/07/14  2223 History   First MD Initiated Contact with Patient 07/08/14 0140     Chief Complaint  Patient presents with  . Headache     (Consider location/radiation/quality/duration/timing/severity/associated sxs/prior Treatment) HPI Comments: Patient is a 34 year old female past medical history significant for headaches, pregnancy-induced hypertension presenting to the emergency department for evaluation of 2 days of gradually worsening frontal headache. Patient describes her headache as throbbing pounding in nature. She endorses intermittent episodes of blurred vision. She also endorses nausea with multiple episodes of emesis. She states last 2 episodes of had streaks of blood. She endorses a similar history of headaches last one a few days ago." She has tried taking Tylenol and DayQuil with no improvement of her symptoms. No modifying factors identified. Denies any fevers, syncope, rash. Last menstrual period was one week ago.  Patient is a 34 y.o. female presenting with headaches. The history is provided by the patient.  Headache Pain location:  Frontal Quality: throbbing pounding. Radiates to:  Eyes Associated symptoms: nausea and vomiting     Past Medical History  Diagnosis Date  . Headache(784.0)   . Pregnancy induced hypertension   . Shortness of breath   . Chlamydia    Past Surgical History  Procedure Laterality Date  . Tooth extraction    . Cesarean section    . Cesarean section with bilateral tubal ligation Bilateral 09/01/2012    Procedure: CESAREAN SECTION WITH BILATERAL TUBAL LIGATION;  Surgeon: Jonnie Kind, MD;  Location: [facility];  Service: Obstetrics;  Laterality: Bilateral;   No family history on file. History  Substance Use Topics  . Smoking status: Former Smoker -- 0.25 packs/day    Types: Cigarettes    Quit date: 04/01/2012  . Smokeless tobacco: Never Used  . Alcohol Use: No   OB History    Gravida Para Term  Preterm AB TAB SAB Ectopic Multiple Living   2 2 2       2      Review of Systems  Eyes: Positive for visual disturbance.  Gastrointestinal: Positive for nausea and vomiting.  Neurological: Positive for headaches.  All other systems reviewed and are negative.     Allergies  Onion  Home Medications   Prior to Admission medications   Medication Sig Start Date End Date Taking? Authorizing Provider  acetaminophen (TYLENOL) 500 MG tablet Take 500 mg by mouth every 6 (six) hours as needed for mild pain or headache.   Yes Historical Provider, MD  cyclobenzaprine (FLEXERIL) 10 MG tablet Take 1 tablet (10 mg total) by mouth 2 (two) times daily as needed for muscle spasms. Patient not taking: Reported on 02/16/2014 02/14/14   Etta Quill, NP  ferrous sulfate 300 (60 FE) MG/5ML syrup Take 5 mLs (300 mg total) by mouth 2 (two) times daily with a meal. Patient not taking: Reported on 02/16/2014 09/03/12   Martha Clan, MD  hydrochlorothiazide (HYDRODIURIL) 12.5 MG tablet Take 1 tablet (12.5 mg total) by mouth daily. Patient not taking: Reported on 02/16/2014 09/03/12   Martha Clan, MD  ibuprofen (ADVIL,MOTRIN) 100 MG/5ML suspension Take 30 mLs (600 mg total) by mouth every 6 (six) hours. Patient not taking: Reported on 02/16/2014 09/03/12   Martha Clan, MD  naproxen (NAPROSYN) 500 MG tablet Take 1 tablet (500 mg total) by mouth 2 (two) times daily. Patient not taking: Reported on 02/16/2014 02/14/14   Etta Quill, NP  oxyCODONE-acetaminophen (ROXICET) 5-325 MG/5ML solution Take 5-10  mLs by mouth every 4 (four) hours as needed. Patient not taking: Reported on 02/16/2014 09/03/12   Martha Clan, MD  promethazine (PHENERGAN) 25 MG tablet Take 1 tablet (25 mg total) by mouth every 6 (six) hours as needed for nausea or vomiting. Patient not taking: Reported on 07/07/2014 02/16/14   Cleatrice Burke, PA-C  senna-docusate (SENOKOT-S) 8.6-50 MG per tablet Take 2 tablets by mouth at bedtime. Patient not taking:  Reported on 02/16/2014 09/03/12   Martha Clan, MD  traMADol (ULTRAM) 50 MG tablet Take 1 tablet (50 mg total) by mouth every 6 (six) hours as needed. Patient not taking: Reported on 07/07/2014 02/16/14   Cleatrice Burke, PA-C   BP 122/88 mmHg  Pulse 77  Temp(Src) 98.4 F (36.9 C)  Resp 14  Ht 5\' 1"  (1.549 m)  Wt 146 lb (66.225 kg)  BMI 27.60 kg/m2  SpO2 97%  LMP 06/30/2014 Physical Exam  Constitutional: She is oriented to person, place, and time. She appears well-developed and well-nourished. No distress.  HENT:  Head: Normocephalic and atraumatic.  Right Ear: External ear normal.  Left Ear: External ear normal.  Nose: Nose normal.  Mouth/Throat: Oropharynx is clear and moist. No oropharyngeal exudate.  Eyes: Conjunctivae and EOM are normal. Pupils are equal, round, and reactive to light.  Neck: Normal range of motion. Neck supple.  Cardiovascular: Normal rate, regular rhythm, normal heart sounds and intact distal pulses.   Pulmonary/Chest: Effort normal and breath sounds normal. No respiratory distress.  Abdominal: Soft. There is no tenderness.  Neurological: She is alert and oriented to person, place, and time. She has normal strength. No cranial nerve deficit. Gait normal. GCS eye subscore is 4. GCS verbal subscore is 5. GCS motor subscore is 6.  Sensation grossly intact.  No pronator drift.  Bilateral heel-knee-shin intact.  Skin: Skin is warm and dry. She is not diaphoretic.  Nursing note and vitals reviewed.   ED Course  Procedures (including critical care time) Medications  diphenhydrAMINE (BENADRYL) injection 25 mg (25 mg Intramuscular Given 07/08/14 0205)  metoCLOPramide (REGLAN) injection 10 mg (10 mg Intramuscular Given 07/08/14 0204)     Labs Review Labs Reviewed  CBC WITH DIFFERENTIAL/PLATELET - Abnormal; Notable for the following:    Hemoglobin 11.5 (*)    HCT 35.7 (*)    MCV 77.8 (*)    MCH 25.1 (*)    All other components within normal limits  I-STAT CHEM 8,  ED - Abnormal; Notable for the following:    Glucose, Bld 102 (*)    Calcium, Ion 1.29 (*)    All other components within normal limits  I-STAT BETA HCG BLOOD, ED ([facility], [facility], [facility] ONLY)    Imaging Review No results found.   EKG Interpretation None      MDM   Final diagnoses:  Bad headache    Filed Vitals:   07/08/14 0330  BP: 122/88  Pulse: 77  Temp:   Resp:    Afebrile, NAD, non-toxic appearing, AAOx4.    Pt HA treated and improved while in ED.  Presentation is like pts typical HA and non concerning for [facility], [place], ICH, Meningitis, or temporal arteritis. Pt is afebrile with no focal neuro deficits, nuchal rigidity, or change in vision. Pt is to follow up with PCP to discuss prophylactic medication. Pt verbalizes understanding and is agreeable with plan to dc.  Patient is stable at time of discharge   Baron Sane, PA-C 07/08/14 2002  Everlene Balls, MD 07/09/14 [phone]

## 2014-07-08 NOTE — ED Notes (Signed)
Pt c/o frontal headache x 2 days associated with nausea and vomiting.

## 2015-08-21 ENCOUNTER — Emergency Department (HOSPITAL_COMMUNITY)
Admission: EM | Admit: 2015-08-21 | Discharge: 2015-08-21 | Disposition: A | Discharge status: Home / Self Care | Payer: Medicaid Other | Attending: Emergency Medicine | Admitting: Emergency Medicine

## 2015-08-21 ENCOUNTER — Emergency Department (HOSPITAL_COMMUNITY): Payer: Medicaid Other

## 2015-08-21 ENCOUNTER — Encounter (HOSPITAL_COMMUNITY): Payer: Self-pay | Admitting: Emergency Medicine

## 2015-08-21 DIAGNOSIS — Z87891 Personal history of nicotine dependence: Secondary | ICD-10-CM | POA: Insufficient documentation

## 2015-08-21 DIAGNOSIS — Z8619 Personal history of other infectious and parasitic diseases: Secondary | ICD-10-CM | POA: Insufficient documentation

## 2015-08-21 DIAGNOSIS — B9789 Other viral agents as the cause of diseases classified elsewhere: Secondary | ICD-10-CM

## 2015-08-21 DIAGNOSIS — J029 Acute pharyngitis, unspecified: Secondary | ICD-10-CM | POA: Diagnosis present

## 2015-08-21 DIAGNOSIS — J069 Acute upper respiratory infection, unspecified: Secondary | ICD-10-CM | POA: Diagnosis not present

## 2015-08-21 LAB — RAPID STREP SCREEN (MED CTR MEBANE ONLY): STREPTOCOCCUS, GROUP A SCREEN (DIRECT): NEGATIVE

## 2015-08-21 MED ORDER — PHENOL 1.4 % MT LIQD: 1.0000 | OROMUCOSAL | Status: DC | PRN

## 2015-08-21 MED ORDER — BENZONATATE 100 MG PO CAPS: 100.0000 mg | ORAL_CAPSULE | Freq: Three times a day (TID) | ORAL | Status: DC

## 2015-08-21 NOTE — ED Notes (Signed)
Pt c/o sore throat onset yesterday. Today pt woke up with productive cough. Pt reports cough with brown secretions and streaks of blood.

## 2015-08-21 NOTE — Discharge Instructions (Signed)
Take the prescribed medication as directed. °Follow-up with your primary care doctor. °Return to the ED for new or worsening symptoms. °

## 2015-08-21 NOTE — ED Provider Notes (Signed)
CSN: [id number]     Arrival date & time 08/21/15  [id number] History  By signing my name below, I, Essence Howell, attest that this documentation has been prepared under the direction and in the presence of Quincy Carnes, PA-C Electronically Signed: Ladene Artist, ED Scribe 08/21/2015 at 10:59 AM  Chief Complaint  Patient presents with  . Cough  . Sore Throat   The history is provided by the patient. No language interpreter was used.   HPI Comments: Katie Garrett is a 35 y.o. female who presents to the Emergency Department complaining of gradually worsening sore throat onset yesterday. Pt's sore throat is exacerbated with swallowing, no difficulty swallowing. She reports an associated symptom of productive cough with brown sputum and streaks of blood. No chest pain or SOB.  No hx of PE or DVT.  Not currently on birth control, no recent travel.  Pt's daughter had strep throat a few weeks ago. She denies fever. Pt is a current smoker.   Past Medical History  Diagnosis Date  . Headache(784.0)   . Pregnancy induced hypertension   . Shortness of breath   . Chlamydia    Past Surgical History  Procedure Laterality Date  . Tooth extraction    . Cesarean section    . Cesarean section with bilateral tubal ligation Bilateral 09/01/2012    Procedure: CESAREAN SECTION WITH BILATERAL TUBAL LIGATION;  Surgeon: Jonnie Kind, MD;  Location: [facility];  Service: Obstetrics;  Laterality: Bilateral;   No family history on file. Social History  Substance Use Topics  . Smoking status: Former Smoker -- 0.25 packs/day    Types: Cigarettes    Quit date: 04/01/2012  . Smokeless tobacco: Never Used  . Alcohol Use: No   OB History    Gravida Para Term Preterm AB TAB SAB Ectopic Multiple Living   2 2 2       2      Review of Systems  Constitutional: Negative for fever.  HENT: Positive for sore throat.   Respiratory: Positive for cough.   All other systems reviewed and are negative.  Allergies   Onion  Home Medications   Prior to Admission medications   Medication Sig Start Date End Date Taking? Authorizing Provider  acetaminophen (TYLENOL) 500 MG tablet Take 500 mg by mouth every 6 (six) hours as needed for mild pain or headache.    Historical Provider, MD  cyclobenzaprine (FLEXERIL) 10 MG tablet Take 1 tablet (10 mg total) by mouth 2 (two) times daily as needed for muscle spasms. Patient not taking: Reported on 02/16/2014 02/14/14   Etta Quill, NP  ferrous sulfate 300 (60 FE) MG/5ML syrup Take 5 mLs (300 mg total) by mouth 2 (two) times daily with a meal. Patient not taking: Reported on 02/16/2014 09/03/12   Martha Clan, MD  hydrochlorothiazide (HYDRODIURIL) 12.5 MG tablet Take 1 tablet (12.5 mg total) by mouth daily. Patient not taking: Reported on 02/16/2014 09/03/12   Martha Clan, MD  ibuprofen (ADVIL,MOTRIN) 100 MG/5ML suspension Take 30 mLs (600 mg total) by mouth every 6 (six) hours. Patient not taking: Reported on 02/16/2014 09/03/12   Martha Clan, MD  naproxen (NAPROSYN) 500 MG tablet Take 1 tablet (500 mg total) by mouth 2 (two) times daily. Patient not taking: Reported on 02/16/2014 02/14/14   Etta Quill, NP  oxyCODONE-acetaminophen (ROXICET) 5-325 MG/5ML solution Take 5-10 mLs by mouth every 4 (four) hours as needed. Patient not taking: Reported on 02/16/2014 09/03/12   Martha Clan, MD  promethazine (PHENERGAN) 25 MG tablet Take 1 tablet (25 mg total) by mouth every 6 (six) hours as needed for nausea or vomiting. Patient not taking: Reported on 07/07/2014 02/16/14   Cleatrice Burke, PA-C  senna-docusate (SENOKOT-S) 8.6-50 MG per tablet Take 2 tablets by mouth at bedtime. Patient not taking: Reported on 02/16/2014 09/03/12   Martha Clan, MD  traMADol (ULTRAM) 50 MG tablet Take 1 tablet (50 mg total) by mouth every 6 (six) hours as needed. Patient not taking: Reported on 07/07/2014 02/16/14   Cleatrice Burke, PA-C   BP 127/95 mmHg  Pulse 84  Temp(Src) 98.4 F (36.9 C) (Oral)   Resp 18  Ht 5\' 1"  (1.549 m)  SpO2 99%  LMP 08/12/2015  Breastfeeding? No Physical Exam  Constitutional: She is oriented to person, place, and time. She appears well-developed and well-nourished.  HENT:  Head: Normocephalic and atraumatic.  Right Ear: Tympanic membrane and ear canal normal.  Left Ear: Tympanic membrane and ear canal normal.  Nose: Nose normal.  Mouth/Throat: Uvula is midline, oropharynx is clear and moist and mucous membranes are normal. No oropharyngeal exudate, posterior oropharyngeal edema, posterior oropharyngeal erythema or tonsillar abscesses.  Tonsils overall normal in appearance bilaterally without exudate; uvula midline without evidence of peritonsillar abscess; handling secretions appropriately; no difficulty swallowing or speaking; normal phonation without stridor  Eyes: Conjunctivae and EOM are normal. Pupils are equal, round, and reactive to light.  Neck: Normal range of motion.  Cardiovascular: Normal rate, regular rhythm and normal heart sounds.   Pulmonary/Chest: Effort normal and breath sounds normal. She has no wheezes. She has no rhonchi. She has no rales.  Abdominal: Soft. Bowel sounds are normal.  Musculoskeletal: Normal range of motion.  Neurological: She is alert and oriented to person, place, and time.  Skin: Skin is warm and dry.  Psychiatric: She has a normal mood and affect.  Nursing note and vitals reviewed.  ED Course  Procedures (including critical care time) DIAGNOSTIC STUDIES: Oxygen Saturation is 99% on RA, normal by my interpretation.    COORDINATION OF CARE: 9:58 AM-Discussed treatment plan which includes strep screen, CXR, Tessalon and Chloraseptic with pt at bedside and pt agreed to plan.   Labs Review Labs Reviewed  RAPID STREP SCREEN (NOT AT [facility])  CULTURE, GROUP A STREP [facility])   Imaging Review Dg Chest 2 View  08/21/2015  CLINICAL DATA:  Productive cough and sore throat.  Smoker. EXAM: CHEST  2 VIEW COMPARISON:   08/12/2005 FINDINGS: The heart size and mediastinal contours are within normal limits. Both lungs are clear. The visualized skeletal structures are unremarkable. IMPRESSION: No active cardiopulmonary disease. Electronically Signed   By: Markus Daft M.D.   On: 08/21/2015 10:48   I have personally reviewed and evaluated these images and lab results as part of my medical decision-making.   EKG Interpretation None      MDM   Final diagnoses:  Viral URI with cough   35 year old female here with sore throat and productive cough since yesterday. Patient is afebrile, nontoxic. Her exam is overall benign. Her lungs are clear. She is in no apparent restorative distress. Her vital signs are stable. Patient did report some blood streaks in her sputum this morning. Patient denies any chest pain or shortness of breath. She is PERC negative without hx of DVT or PE so feel this is less likely.  Rapid strep is negative, culture pending. Chest x-ray is clear. Patient's constellation of symptoms is likely viral in nature. Some blood tinge  in her sputum may be due to her smoking. Will discharge home with supportive care-- Rx tessalon, chloraseptic. Follow-up with PCP.  Discussed plan with patient, he/she acknowledged understanding and agreed with plan of care.  Return precautions given for new or worsening symptoms.  I personally performed the services described in this documentation, which was scribed in my presence. The recorded information has been reviewed and is accurate.  Larene Pickett, PA-C 08/21/15 [place], MD 08/21/15 2028

## 2015-08-23 LAB — CULTURE, GROUP A STREP (THRC)

## 2015-09-28 ENCOUNTER — Emergency Department (HOSPITAL_COMMUNITY): Payer: Medicaid Other

## 2015-09-28 ENCOUNTER — Encounter (HOSPITAL_COMMUNITY): Payer: Self-pay | Admitting: Emergency Medicine

## 2015-09-28 DIAGNOSIS — S90935A Unspecified superficial injury of left lesser toe(s), initial encounter: Secondary | ICD-10-CM | POA: Diagnosis not present

## 2015-09-28 DIAGNOSIS — Z87891 Personal history of nicotine dependence: Secondary | ICD-10-CM | POA: Diagnosis not present

## 2015-09-28 DIAGNOSIS — Y939 Activity, unspecified: Secondary | ICD-10-CM | POA: Insufficient documentation

## 2015-09-28 DIAGNOSIS — Z5321 Procedure and treatment not carried out due to patient leaving prior to being seen by health care provider: Secondary | ICD-10-CM | POA: Diagnosis not present

## 2015-09-28 DIAGNOSIS — W2201XA Walked into wall, initial encounter: Secondary | ICD-10-CM | POA: Diagnosis not present

## 2015-09-28 DIAGNOSIS — Y92012 Bathroom of single-family (private) house as the place of occurrence of the external cause: Secondary | ICD-10-CM | POA: Diagnosis not present

## 2015-09-28 DIAGNOSIS — Y999 Unspecified external cause status: Secondary | ICD-10-CM | POA: Diagnosis not present

## 2015-09-28 NOTE — ED Notes (Signed)
Patient states she was running in the house to get to the bathroom and hit her foot on sharp crown molding of a wall.  She has some swelling to left 4th toe and pain.  Patient is limping in triage.

## 2015-09-29 ENCOUNTER — Emergency Department (HOSPITAL_COMMUNITY)
Admission: EM | Admit: 2015-09-29 | Discharge: 2015-09-29 | Disposition: A | Discharge status: Home / Self Care | Payer: Medicaid Other | Attending: Dermatology | Admitting: Dermatology

## 2016-01-16 ENCOUNTER — Encounter (HOSPITAL_COMMUNITY): Payer: Self-pay

## 2016-01-16 DIAGNOSIS — Z87891 Personal history of nicotine dependence: Secondary | ICD-10-CM | POA: Diagnosis not present

## 2016-01-16 DIAGNOSIS — R1110 Vomiting, unspecified: Secondary | ICD-10-CM | POA: Insufficient documentation

## 2016-01-16 DIAGNOSIS — Z5321 Procedure and treatment not carried out due to patient leaving prior to being seen by health care provider: Secondary | ICD-10-CM | POA: Insufficient documentation

## 2016-01-16 DIAGNOSIS — R51 Headache: Secondary | ICD-10-CM | POA: Diagnosis present

## 2016-01-16 NOTE — ED Triage Notes (Signed)
Onset 2 days headache, constant.  Vomited x 4-5 today.  Pt took Advil pm @ 9pm, with no relief.  No other s/s noted.

## 2016-01-17 ENCOUNTER — Emergency Department (HOSPITAL_COMMUNITY)
Admission: EM | Admit: 2016-01-17 | Discharge: 2016-01-17 | Disposition: A | Discharge status: Home / Self Care | Payer: Medicaid Other | Attending: Dermatology | Admitting: Dermatology

## 2016-01-17 NOTE — ED Notes (Signed)
Patient to registration stating she was leaving.

## 2016-02-17 ENCOUNTER — Emergency Department (HOSPITAL_COMMUNITY)
Admission: EM | Admit: 2016-02-17 | Discharge: 2016-02-17 | Disposition: A | Discharge status: Home / Self Care | Payer: Medicaid Other | Attending: Emergency Medicine | Admitting: Emergency Medicine

## 2016-02-17 ENCOUNTER — Encounter (HOSPITAL_COMMUNITY): Payer: Self-pay | Admitting: Emergency Medicine

## 2016-02-17 DIAGNOSIS — Z87891 Personal history of nicotine dependence: Secondary | ICD-10-CM | POA: Insufficient documentation

## 2016-02-17 DIAGNOSIS — Z79899 Other long term (current) drug therapy: Secondary | ICD-10-CM | POA: Diagnosis not present

## 2016-02-17 DIAGNOSIS — G43909 Migraine, unspecified, not intractable, without status migrainosus: Secondary | ICD-10-CM | POA: Diagnosis not present

## 2016-02-17 LAB — CBC WITH DIFFERENTIAL/PLATELET
BASOS PCT: 0 %
Basophils Absolute: 0 10*3/uL (ref 0.0–0.1)
Eosinophils Absolute: 0.3 10*3/uL (ref 0.0–0.7)
Eosinophils Relative: 4 %
HEMATOCRIT: 36.5 % (ref 36.0–46.0)
HEMOGLOBIN: 11.9 g/dL — AB (ref 12.0–15.0)
Lymphocytes Relative: 42 %
Lymphs Abs: 3.1 10*3/uL (ref 0.7–4.0)
MCH: 25.2 pg — AB (ref 26.0–34.0)
MCHC: 32.6 g/dL (ref 30.0–36.0)
MCV: 77.2 fL — ABNORMAL LOW (ref 78.0–100.0)
MONOS PCT: 6 %
Monocytes Absolute: 0.4 10*3/uL (ref 0.1–1.0)
NEUTROS ABS: 3.7 10*3/uL (ref 1.7–7.7)
Neutrophils Relative %: 48 %
Platelets: 290 10*3/uL (ref 150–400)
RBC: 4.73 MIL/uL (ref 3.87–5.11)
RDW: 14.5 % (ref 11.5–15.5)
WBC: 7.5 10*3/uL (ref 4.0–10.5)

## 2016-02-17 LAB — I-STAT BETA HCG BLOOD, ED (MC, WL, AP ONLY): I-stat hCG, quantitative: <5 m[IU]/mL (ref <5)

## 2016-02-17 LAB — COMPREHENSIVE METABOLIC PANEL
ALBUMIN: 4.2 g/dL (ref 3.5–5.0)
ALK PHOS: 84 U/L (ref 38–126)
ALT: 7 U/L — AB (ref 14–54)
ANION GAP: 6 (ref 5–15)
AST: 14 U/L — ABNORMAL LOW (ref 15–41)
BUN: 14 mg/dL (ref 6–20)
CALCIUM: 9.1 mg/dL (ref 8.9–10.3)
CO2: 25 mmol/L (ref 22–32)
CREATININE: 0.82 mg/dL (ref 0.44–1.00)
Chloride: 108 mmol/L (ref 101–111)
GFR calc Af Amer: >60 mL/min (ref >60)
GFR calc non Af Amer: >60 mL/min (ref >60)
GLUCOSE: 109 mg/dL — AB (ref 65–99)
Potassium: 3.5 mmol/L (ref 3.5–5.1)
Sodium: 139 mmol/L (ref 135–145)
Total Bilirubin: 0.6 mg/dL (ref 0.3–1.2)
Total Protein: 8.1 g/dL (ref 6.5–8.1)

## 2016-02-17 MED ORDER — ONDANSETRON HCL 4 MG/2ML IJ SOLN
4.0000 mg | Freq: Once | INTRAMUSCULAR | Status: AC
  Administered 2016-02-17: 4 mg via INTRAVENOUS
  Filled 2016-02-17: qty 2

## 2016-02-17 MED ORDER — ONDANSETRON 4 MG PO TBDP: 4.0000 mg | ORAL_TABLET | Freq: Three times a day (TID) | ORAL | 0 refills | Status: DC | PRN

## 2016-02-17 MED ORDER — SODIUM CHLORIDE 0.9 % IV BOLUS (SEPSIS)
500.0000 mL | Freq: Once | INTRAVENOUS | Status: AC
  Administered 2016-02-17: 500 mL via INTRAVENOUS

## 2016-02-17 MED ORDER — BUTALBITAL-APAP-CAFFEINE 50-325-40 MG PO TABS: 1.0000 | ORAL_TABLET | Freq: Four times a day (QID) | ORAL | 0 refills | Status: AC | PRN

## 2016-02-17 MED ORDER — KETOROLAC TROMETHAMINE 30 MG/ML IJ SOLN
30.0000 mg | Freq: Once | INTRAMUSCULAR | Status: AC
  Administered 2016-02-17: 30 mg via INTRAVENOUS
  Filled 2016-02-17: qty 1

## 2016-02-17 NOTE — ED Provider Notes (Signed)
[facility] DEPT Provider Note   CSN: [id number] Arrival date & time: 02/17/16  0025   By signing my name below, I, Katie Garrett, attest that this documentation has been prepared under the direction and in the presence of Shawn Joy, PA-C Electronically Signed: Soijett Garrett, ED Scribe. 02/17/16. 2:04 AM.   History   Chief Complaint Chief Complaint  Patient presents with  . Migraine    HPI Katie Garrett is a 35 y.o. female with a PMHx of HA, who presents to the Emergency Department complaining of throbbing, generalized, moderate headache intermittent and recurrent for the last 11 days ago worsening 5 days ago. Pt reports that she typically gets headaches with her menstrual cycles. Patient's last menstrual period began 02/07/2016, ending yesterday. Patient endorses increased recent stress with an event last week to honor her deceased husband on the first anniversary of his death. Pain improves with hot showers and dark rooms. Endorses staying well-hydrated with drinking 6-7 pints of water a day.   Pt is having associated symptoms of nausea, vomiting, dizziness, and photophobia, which are all typical of her previous headaches. She notes that she has tried Advil PM with mild relief of her symptoms. She denies sore throat, fever, rashes, shortness of breath, neuro deficits, or any other symptoms.     The history is provided by the patient. No language interpreter was used.    Past Medical History:  Diagnosis Date  . Chlamydia   . Headache(784.0)   . Pregnancy induced hypertension   . Shortness of breath     Patient Active Problem List   Diagnosis Date Noted  . Cephalopelvic disproportion, delivered, current hospitalization 09/01/2012    Past Surgical History:  Procedure Laterality Date  . CESAREAN SECTION    . CESAREAN SECTION WITH BILATERAL TUBAL LIGATION Bilateral 09/01/2012   Procedure: CESAREAN SECTION WITH BILATERAL TUBAL LIGATION;  Surgeon: Jonnie Kind, MD;   Location: [facility];  Service: Obstetrics;  Laterality: Bilateral;  . TOOTH EXTRACTION      OB History    Gravida Para Term Preterm AB Living   2 2 2     2    SAB TAB Ectopic Multiple Live Births           1       Home Medications    Prior to Admission medications   Medication Sig Start Date End Date Taking? Authorizing Provider  acetaminophen (TYLENOL) 500 MG tablet Take 500 mg by mouth every 6 (six) hours as needed for mild pain or headache.    Historical Provider, MD  benzonatate (TESSALON) 100 MG capsule Take 1 capsule (100 mg total) by mouth every 8 (eight) hours. 08/21/15   Larene Pickett, PA-C  butalbital-acetaminophen-caffeine (FIORICET, ESGIC) [phone] MG tablet Take 1-2 tablets by mouth every 6 (six) hours as needed for headache. 02/17/16 02/16/17  Shawn C Joy, PA-C  cyclobenzaprine (FLEXERIL) 10 MG tablet Take 1 tablet (10 mg total) by mouth 2 (two) times daily as needed for muscle spasms. Patient not taking: Reported on 02/16/2014 02/14/14   Etta Quill, NP  ferrous sulfate 300 (60 FE) MG/5ML syrup Take 5 mLs (300 mg total) by mouth 2 (two) times daily with a meal. Patient not taking: Reported on 02/16/2014 09/03/12   Martha Clan, MD  hydrochlorothiazide (HYDRODIURIL) 12.5 MG tablet Take 1 tablet (12.5 mg total) by mouth daily. Patient not taking: Reported on 02/16/2014 09/03/12   Martha Clan, MD  ibuprofen (ADVIL,MOTRIN) 100 MG/5ML suspension Take 30 mLs (600 mg  total) by mouth every 6 (six) hours. Patient not taking: Reported on 02/16/2014 09/03/12   Martha Clan, MD  naproxen (NAPROSYN) 500 MG tablet Take 1 tablet (500 mg total) by mouth 2 (two) times daily. Patient not taking: Reported on 02/16/2014 02/14/14   Etta Quill, NP  ondansetron (ZOFRAN ODT) 4 MG disintegrating tablet Take 1 tablet (4 mg total) by mouth every 8 (eight) hours as needed for nausea or vomiting. 02/17/16   Shawn C Joy, PA-C  oxyCODONE-acetaminophen (ROXICET) 5-325 MG/5ML solution Take 5-10 mLs by mouth  every 4 (four) hours as needed. Patient not taking: Reported on 02/16/2014 09/03/12   Martha Clan, MD  phenol (CHLORASEPTIC) 1.4 % LIQD Use as directed 1 spray in the mouth or throat as needed for throat irritation / pain. 08/21/15   Larene Pickett, PA-C  promethazine (PHENERGAN) 25 MG tablet Take 1 tablet (25 mg total) by mouth every 6 (six) hours as needed for nausea or vomiting. Patient not taking: Reported on 07/07/2014 02/16/14   Cleatrice Burke, PA-C  senna-docusate (SENOKOT-S) 8.6-50 MG per tablet Take 2 tablets by mouth at bedtime. Patient not taking: Reported on 02/16/2014 09/03/12   Martha Clan, MD  traMADol (ULTRAM) 50 MG tablet Take 1 tablet (50 mg total) by mouth every 6 (six) hours as needed. Patient not taking: Reported on 07/07/2014 02/16/14   Cleatrice Burke, PA-C    Family History No family history on file.  Social History Social History  Substance Use Topics  . Smoking status: Former Smoker    Packs/day: 0.25    Types: Cigarettes    Quit date: 04/01/2012  . Smokeless tobacco: Never Used  . Alcohol use No     Allergies   Onion   Review of Systems Review of Systems  Constitutional: Negative for chills, diaphoresis and fever.  HENT: Negative for sore throat.   Eyes: Positive for photophobia.  Respiratory: Positive for cough.   Gastrointestinal: Positive for nausea and vomiting.  Skin: Negative for rash.  Neurological: Positive for dizziness and headaches. Negative for syncope, weakness and numbness.  All other systems reviewed and are negative.   Physical Exam Updated Vital Signs BP 144/98 (BP Location: Left Arm)   Pulse 68   Temp 97.9 F (36.6 C) (Oral)   Resp 15   LMP 02/07/2016   SpO2 96%   Physical Exam  Constitutional: She appears well-developed and well-nourished. No distress.  HENT:  Head: Normocephalic and atraumatic.  Eyes: Conjunctivae and EOM are normal. Pupils are equal, round, and reactive to light.  Neck: Normal range of motion. Neck  supple.  No meningismus signs  Cardiovascular: Normal rate, regular rhythm, normal heart sounds and intact distal pulses.   Pulmonary/Chest: Effort normal and breath sounds normal. No respiratory distress.  Abdominal: Soft. There is no tenderness. There is no guarding.  Musculoskeletal: She exhibits no edema.  Normal motor function intact in all extremities and spine. No midline spinal tenderness.   Lymphadenopathy:    She has no cervical adenopathy.  Neurological: She is alert.  No sensory deficits. Strength 5/5 in all extremities. No gait disturbance. Coordination intact. Cranial nerves III-XII grossly intact. No facial droop.   Skin: Skin is warm and dry. She is not diaphoretic.  Psychiatric: She has a normal mood and affect. Her behavior is normal.  Nursing note and vitals reviewed.    ED Treatments / Results  DIAGNOSTIC STUDIES: Oxygen Saturation is 96% on RA, nl by my interpretation.    COORDINATION OF CARE: 2:01  AM Discussed treatment plan with pt at bedside which includes toradol injection, zofran, labs, and pt agreed to plan.   Labs (all labs ordered are listed, but only abnormal results are displayed) Labs Reviewed  COMPREHENSIVE METABOLIC PANEL - Abnormal; Notable for the following:       Result Value   Glucose, Bld 109 (*)    AST 14 (*)    ALT 7 (*)    All other components within normal limits  CBC WITH DIFFERENTIAL/PLATELET - Abnormal; Notable for the following:    Hemoglobin 11.9 (*)    MCV 77.2 (*)    MCH 25.2 (*)    All other components within normal limits  I-STAT BETA HCG BLOOD, ED ([facility], [facility], [facility] ONLY)    Procedures Procedures (including critical care time)  Medications Ordered in ED Medications  sodium chloride 0.9 % bolus 500 mL (0 mLs Intravenous Stopped 02/17/16 0252)  ketorolac (TORADOL) 30 MG/ML injection 30 mg (30 mg Intravenous Given 02/17/16 0220)  ondansetron (ZOFRAN) injection 4 mg (4 mg Intravenous Given 02/17/16 0219)     Initial  Impression / Assessment and Plan / ED Course  I have reviewed the triage vital signs and the nursing notes.  Pertinent labs that were available during my care of the patient were reviewed by me and considered in my medical decision making (see chart for details).  Clinical Course      Patient presents with a recurrence of her chronic headaches. No neuro or functional deficits. No red flag symptoms. Upon reassessment, patient voiced improvement in her symptoms. The patient was given instructions for home care as well as return precautions. Patient voices understanding of these instructions, accepts the plan, and is comfortable with discharge.   Vitals:   02/17/16 0035 02/17/16 0249 02/17/16 0420  BP: 144/98 144/83 135/89  Pulse: 68 76 71  Resp: 15 16 16   Temp: 97.9 F (36.6 C)  98.1 F (36.7 C)  TempSrc: Oral  Oral  SpO2: 96% 100% 100%     Final Clinical Impressions(s) / ED Diagnoses   Final diagnoses:  Migraine without status migrainosus, not intractable, unspecified migraine type    New Prescriptions Discharge Medication List as of 02/17/2016  4:08 AM    START taking these medications   Details  butalbital-acetaminophen-caffeine (FIORICET, ESGIC) 50-325-40 MG tablet Take 1-2 tablets by mouth every 6 (six) hours as needed for headache., Starting Wed 02/17/2016, Until Thu 02/16/2017, Print    ondansetron (ZOFRAN ODT) 4 MG disintegrating tablet Take 1 tablet (4 mg total) by mouth every 8 (eight) hours as needed for nausea or vomiting., Starting Wed 02/17/2016, Print       I personally performed the services described in this documentation, which was scribed in my presence. The recorded information has been reviewed and is accurate.    Lorayne Bender, PA-C 02/18/16 [place], MD 02/23/16 2302

## 2016-02-17 NOTE — Discharge Instructions (Signed)
You have been seen today for a headache. Your lab tests showed no acute abnormalities. Stay well-hydrated and get plenty of rest. May use the Fioricet at onset of a headache. Follow up with a PCP as soon as possible on this matter. Return to ED should symptoms worsen.

## 2016-02-17 NOTE — ED Triage Notes (Signed)
Pt c/o migraine that began last Monday; states headache comes and goes and is alleviated by Tylenol PM; endorses vomiting, states she's been throwing up "all night"; A&Ox4

## 2017-02-06 ENCOUNTER — Emergency Department (HOSPITAL_COMMUNITY)
Admission: EM | Admit: 2017-02-06 | Discharge: 2017-02-06 | Disposition: A | Discharge status: Home / Self Care | Payer: Medicaid Other | Attending: Emergency Medicine | Admitting: Emergency Medicine

## 2017-02-06 ENCOUNTER — Encounter (HOSPITAL_COMMUNITY): Payer: Self-pay | Admitting: Emergency Medicine

## 2017-02-06 DIAGNOSIS — Z5321 Procedure and treatment not carried out due to patient leaving prior to being seen by health care provider: Secondary | ICD-10-CM | POA: Diagnosis not present

## 2017-02-06 DIAGNOSIS — R51 Headache: Secondary | ICD-10-CM | POA: Diagnosis present

## 2017-02-06 MED ORDER — ONDANSETRON 4 MG PO TBDP
4.0000 mg | ORAL_TABLET | Freq: Once | ORAL | Status: AC
  Administered 2017-02-06: 4 mg via ORAL
  Filled 2017-02-06: qty 1

## 2017-02-06 NOTE — ED Notes (Signed)
Patient out of medications for HTN and doesn't see PCP.

## 2017-02-06 NOTE — ED Triage Notes (Signed)
Patient c/o frontal migraine that has been ongoing over past 3 days with no relief with OTC pain medications. Patient c/o blurred vision and nausea and vomiting.

## 2017-03-01 ENCOUNTER — Other Ambulatory Visit: Payer: Self-pay

## 2017-03-01 ENCOUNTER — Encounter (HOSPITAL_COMMUNITY): Payer: Self-pay | Admitting: *Deleted

## 2017-03-01 ENCOUNTER — Emergency Department (HOSPITAL_COMMUNITY)
Admission: EM | Admit: 2017-03-01 | Discharge: 2017-03-01 | Disposition: A | Discharge status: Home / Self Care | Payer: Medicaid Other | Attending: Emergency Medicine | Admitting: Emergency Medicine

## 2017-03-01 DIAGNOSIS — Z79899 Other long term (current) drug therapy: Secondary | ICD-10-CM | POA: Diagnosis not present

## 2017-03-01 DIAGNOSIS — A691 Other Vincent's infections: Secondary | ICD-10-CM | POA: Insufficient documentation

## 2017-03-01 DIAGNOSIS — Z87891 Personal history of nicotine dependence: Secondary | ICD-10-CM | POA: Insufficient documentation

## 2017-03-01 DIAGNOSIS — K047 Periapical abscess without sinus: Secondary | ICD-10-CM | POA: Insufficient documentation

## 2017-03-01 DIAGNOSIS — K0889 Other specified disorders of teeth and supporting structures: Secondary | ICD-10-CM | POA: Diagnosis present

## 2017-03-01 DIAGNOSIS — Z23 Encounter for immunization: Secondary | ICD-10-CM | POA: Diagnosis not present

## 2017-03-01 MED ORDER — TETANUS-DIPHTH-ACELL PERTUSSIS 5-2.5-18.5 LF-MCG/0.5 IM SUSP
0.5000 mL | Freq: Once | INTRAMUSCULAR | Status: AC
  Administered 2017-03-01: 0.5 mL via INTRAMUSCULAR
  Filled 2017-03-01: qty 0.5

## 2017-03-01 MED ORDER — METRONIDAZOLE 250 MG PO TABS: 250.0000 mg | ORAL_TABLET | Freq: Three times a day (TID) | ORAL | 0 refills | Status: AC

## 2017-03-01 MED ORDER — BENZOCAINE (TOPICAL) 20 % EX AERO
INHALATION_SPRAY | CUTANEOUS | Status: AC
  Administered 2017-03-01: 15:00:00 via OROMUCOSAL
  Filled 2017-03-01: qty 57

## 2017-03-01 MED ORDER — AMOXICILLIN 500 MG PO CAPS: 500.0000 mg | ORAL_CAPSULE | Freq: Three times a day (TID) | ORAL | 0 refills | Status: AC

## 2017-03-01 NOTE — ED Provider Notes (Signed)
[facility] EMERGENCY DEPARTMENT Provider Note   CSN: [id number] Arrival date & time: 03/01/17  1204     History   Chief Complaint Chief Complaint  Patient presents with  . Dental Pain    HPI Hang L Beckey Downing is a 36 y.o. female.  Who presents today for evaluation of dental pain.  She reports that over the past 2 days she has had pain and swelling to the left anterior superior gumline.  She reports that she had a baby tooth in this area that was broken off years ago and extracted when she was a child.  She reports that this area is painful.  She is unsure when her last tetanus shot was.  She has not seen a dentist for this issue, however reports that she does have a dentist to follow-up with.  She has not tried anything for her pain prior to arrival.   HPI  Past Medical History:  Diagnosis Date  . Chlamydia   . Headache(784.0)   . Pregnancy induced hypertension   . Shortness of breath     Patient Active Problem List   Diagnosis Date Noted  . Cephalopelvic disproportion, delivered, current hospitalization 09/01/2012    Past Surgical History:  Procedure Laterality Date  . CESAREAN SECTION    . CESAREAN SECTION WITH BILATERAL TUBAL LIGATION Bilateral 09/01/2012   Procedure: CESAREAN SECTION WITH BILATERAL TUBAL LIGATION;  Surgeon: Jonnie Kind, MD;  Location: [facility];  Service: Obstetrics;  Laterality: Bilateral;  . TOOTH EXTRACTION      OB History    Gravida Para Term Preterm AB Living   2 2 2     2    SAB TAB Ectopic Multiple Live Births           1       Home Medications    Prior to Admission medications   Medication Sig Start Date End Date Taking? Authorizing Provider  acetaminophen (TYLENOL) 500 MG tablet Take 500 mg by mouth every 6 (six) hours as needed for mild pain or headache.    [provider]  amoxicillin (AMOXIL) 500 MG capsule Take 1 capsule (500 mg total) by mouth 3 (three) times daily for 10 days. 03/01/17 03/11/17   Lorin Glass, PA-C  benzonatate (TESSALON) 100 MG capsule Take 1 capsule (100 mg total) by mouth every 8 (eight) hours. 08/21/15   Larene Pickett, PA-C  cyclobenzaprine (FLEXERIL) 10 MG tablet Take 1 tablet (10 mg total) by mouth 2 (two) times daily as needed for muscle spasms. Patient not taking: Reported on 02/16/2014 02/14/14   Etta Quill, NP  ferrous sulfate 300 (60 FE) MG/5ML syrup Take 5 mLs (300 mg total) by mouth 2 (two) times daily with a meal. Patient not taking: Reported on 02/16/2014 09/03/12   Martha Clan, MD  hydrochlorothiazide (HYDRODIURIL) 12.5 MG tablet Take 1 tablet (12.5 mg total) by mouth daily. Patient not taking: Reported on 02/16/2014 09/03/12   Martha Clan, MD  ibuprofen (ADVIL,MOTRIN) 100 MG/5ML suspension Take 30 mLs (600 mg total) by mouth every 6 (six) hours. Patient not taking: Reported on 02/16/2014 09/03/12   Martha Clan, MD  metroNIDAZOLE (FLAGYL) 250 MG tablet Take 1 tablet (250 mg total) by mouth 3 (three) times daily for 10 days. 03/01/17 03/11/17  Lorin Glass, PA-C  naproxen (NAPROSYN) 500 MG tablet Take 1 tablet (500 mg total) by mouth 2 (two) times daily. Patient not taking: Reported on 02/16/2014 02/14/14   Etta Quill, NP  ondansetron (ZOFRAN ODT) 4 MG disintegrating tablet Take 1 tablet (4 mg total) by mouth every 8 (eight) hours as needed for nausea or vomiting. 02/17/16   Joy, Shawn C, PA-C  oxyCODONE-acetaminophen (ROXICET) 5-325 MG/5ML solution Take 5-10 mLs by mouth every 4 (four) hours as needed. Patient not taking: Reported on 02/16/2014 09/03/12   Martha Clan, MD  phenol (CHLORASEPTIC) 1.4 % LIQD Use as directed 1 spray in the mouth or throat as needed for throat irritation / pain. 08/21/15   Larene Pickett, PA-C  promethazine (PHENERGAN) 25 MG tablet Take 1 tablet (25 mg total) by mouth every 6 (six) hours as needed for nausea or vomiting. Patient not taking: Reported on 07/07/2014 02/16/14   Cleatrice Burke, PA-C  senna-docusate  (SENOKOT-S) 8.6-50 MG per tablet Take 2 tablets by mouth at bedtime. Patient not taking: Reported on 02/16/2014 09/03/12   Martha Clan, MD  traMADol (ULTRAM) 50 MG tablet Take 1 tablet (50 mg total) by mouth every 6 (six) hours as needed. Patient not taking: Reported on 07/07/2014 02/16/14   Cleatrice Burke, PA-C    Family History No family history on file.  Social History Social History   Tobacco Use  . Smoking status: Former Smoker    Packs/day: 0.25    Types: Cigarettes    Last attempt to quit: 04/01/2012    Years since quitting: 4.9  . Smokeless tobacco: Never Used  Substance Use Topics  . Alcohol use: No  . Drug use: No     Allergies   Onion   Review of Systems Review of Systems   Physical Exam Updated Vital Signs BP (!) 153/100   Pulse 86   Temp 99.7 F (37.6 C) (Oral)   Resp 16   Ht 5\' 1"  (1.549 m)   Wt 66.2 kg (146 lb)   LMP 03/01/2017 (Approximate)   SpO2 97%   BMI 27.59 kg/m   Physical Exam  Constitutional: She is oriented to person, place, and time. She appears well-developed and well-nourished.  HENT:  Head: Normocephalic and atraumatic.  Mouth/Throat: Uvula is midline and oropharynx is clear and moist. No trismus in the jaw. Abnormal dentition. Dental abscesses and dental caries present. No uvula swelling. No oropharyngeal exudate.  No elevation of the floor of the mouth.   There are multiple broken teeth with poor dentition.  There is a subcentimeter area of swelling over tooth 10/11 where there is what appears to be a broken tooth.  Her gums have diffuse 1/2 mm ulcerations through her mouth.    Neck: Normal range of motion. Neck supple.  Cardiovascular: Normal rate.  No murmur heard. Pulmonary/Chest: No stridor. No respiratory distress.  Lymphadenopathy:    She has no cervical adenopathy.  Neurological: She is alert and oriented to person, place, and time.  Skin: She is not diaphoretic.  Psychiatric: She has a normal mood and affect. Her  behavior is normal.  Nursing note and vitals reviewed.    ED Treatments / Results  Labs (all labs ordered are listed, but only abnormal results are displayed) Labs Reviewed - No data to display  EKG  EKG Interpretation None       Radiology No results found.  Procedures .Marland KitchenIncision and Drainage Date/Time: 03/01/2017 3:13 PM Performed by: Lorin Glass, PA-C Authorized by: Lorin Glass, PA-C   Consent:    Consent obtained:  Verbal   Consent given by:  Patient   Risks discussed:  Bleeding, damage to other organs, pain and infection (Loss  of sensation)   Alternatives discussed:  No treatment, alternative treatment and referral Location:    Indications for incision and drainage: Dental abscess.   Location: Approx over tooth 10/11. Anesthesia (see MAR for exact dosages):    Anesthesia method:  Topical application   Topical anesthesia: Hurricaine 20% benzocaine spray. Procedure type:    Complexity:  Simple Procedure details:    Incision types:  Stab incision   Scalpel blade:  11   Drainage:  Purulent   Drainage amount:  Scant Post-procedure details:    Patient tolerance of procedure:  Tolerated well, no immediate complications Comments:     Procedure was performed by my self and Dr. Dallas Schimke    (including critical care time)  Medications Ordered in ED Medications  benzocaine (HURRICAINE) 20 % oral spray ( Mouth/Throat Given 03/01/17 1458)  Tdap (BOOSTRIX) injection 0.5 mL (0.5 mLs Intramuscular Given 03/01/17 1455)     Initial Impression / Assessment and Plan / ED Course  I have reviewed the triage vital signs and the nursing notes.  Pertinent labs & imaging results that were available during my care of the patient were reviewed by me and considered in my medical decision making (see chart for details).    Juliza L Tisdale presents today for evaluation of a swelling over a broken tooth.  Physical exam consistent with dental abscess.  After  discussions with the patient Hurricaine spray was used for anesthetic and incision and drainage was performed with purulent material.  Patient was given return precautions and states understanding.  Patient says that she has a dentist who she will follow-up with.  Patient's bp was elevated and she was informed of the need to follow up.  Patients physical exam was also consistent with acute necrotizing ulcerative gingivitis.  This condition was explained to her.  She will be treated with amoxicillin and flagyl as this will cover both the ANUG and the dental abscess. Tdap updated.   Patient was given return precautions, and states her understanding.  Final Clinical Impressions(s) / ED Diagnoses   Final diagnoses:  Acute necrotizing ulcerative gingivitis  Dental abscess    ED Discharge Orders        Ordered    amoxicillin (AMOXIL) 500 MG capsule  3 times daily     03/01/17 1518    metroNIDAZOLE (FLAGYL) 250 MG tablet  3 times daily     03/01/17 1518       Lorin Glass, [place] 03/01/17 1550    Lajean Saver, MD 03/01/17 [phone]

## 2017-03-01 NOTE — Discharge Instructions (Addendum)
You may have diarrhea from the antibiotics.  It is very important that you continue to take the antibiotics even if you get diarrhea unless a medical professional tells you that you may stop taking them.  If you stop too early the bacteria you are being treated for will become stronger and you may need different, more powerful antibiotics that have more side effects and worsening diarrhea.  Please stay well hydrated and consider probiotics as they may decrease the severity of your diarrhea.    Please take Ibuprofen (Advil, motrin) and Tylenol (acetaminophen) to relieve your pain.  You may take up to 600 MG (3 pills) of normal strength ibuprofen every 8 hours as needed.  In between doses of ibuprofen you make take tylenol, up to 1,000 mg (two extra strength pills).  Do not take more than 3,000 mg tylenol in a 24 hour period.  Please check all medication labels as many medications such as pain and cold medications may contain tylenol.  Do not drink alcohol while taking these medications.  Do not take other NSAID'S while taking ibuprofen (such as aleve or naproxen).  Please take ibuprofen with food to decrease stomach upset.

## 2017-03-01 NOTE — ED Notes (Signed)
Pt unable to sign due to signature pad not working. Pt verbalized understanding of d.c instructions and prescriptions, no further questions.

## 2017-03-01 NOTE — ED Triage Notes (Signed)
Pt c/o L upper dental pain with gum swelling & facial swelling, denies drainage from the area, denies fever, A&O x4

## 2018-03-09 ENCOUNTER — Other Ambulatory Visit: Payer: Self-pay

## 2018-03-09 ENCOUNTER — Encounter (HOSPITAL_COMMUNITY): Payer: Self-pay

## 2018-03-09 ENCOUNTER — Emergency Department (HOSPITAL_COMMUNITY)
Admission: EM | Admit: 2018-03-09 | Discharge: 2018-03-09 | Disposition: A | Discharge status: Home / Self Care | Payer: Medicaid Other | Attending: Emergency Medicine | Admitting: Emergency Medicine

## 2018-03-09 DIAGNOSIS — Z79899 Other long term (current) drug therapy: Secondary | ICD-10-CM | POA: Insufficient documentation

## 2018-03-09 DIAGNOSIS — K0889 Other specified disorders of teeth and supporting structures: Secondary | ICD-10-CM | POA: Insufficient documentation

## 2018-03-09 DIAGNOSIS — Z87891 Personal history of nicotine dependence: Secondary | ICD-10-CM | POA: Diagnosis not present

## 2018-03-09 MED ORDER — MELOXICAM 7.5 MG PO TABS: 7.5000 mg | ORAL_TABLET | Freq: Every day | ORAL | 0 refills | Status: AC

## 2018-03-09 NOTE — ED Triage Notes (Signed)
Pt reports right sided dental pain for 2 days.

## 2018-03-09 NOTE — ED Notes (Signed)
Patient able to ambulate independently  

## 2018-03-09 NOTE — Discharge Instructions (Signed)
Floss teeth well. Rinse with Listerine after every meal.  Follow up with a dentist. Take Meloxicam if needed for pain.

## 2018-03-09 NOTE — ED Provider Notes (Signed)
[facility] EMERGENCY DEPARTMENT Provider Note   CSN: [id number] Arrival date & time: 03/09/18  1643     History   Chief Complaint Chief Complaint  Patient presents with  . Dental Pain    HPI Opie L Wellnitz is a 37 y.o. female.  37 year old female presents with complaint of right lower tooth pain x2 days.  Patient thought she had something stuck between her teeth and tried to pick it out, now has pain in the area.  Denies fever, drainage, trauma.  Pain radiates to right ear with dull headache.  No other complaints or concerns.     Past Medical History:  Diagnosis Date  . Chlamydia   . Headache(784.0)   . Pregnancy induced hypertension   . Shortness of breath     Patient Active Problem List   Diagnosis Date Noted  . Cephalopelvic disproportion, delivered, current hospitalization 09/01/2012    Past Surgical History:  Procedure Laterality Date  . CESAREAN SECTION    . CESAREAN SECTION WITH BILATERAL TUBAL LIGATION Bilateral 09/01/2012   Procedure: CESAREAN SECTION WITH BILATERAL TUBAL LIGATION;  Surgeon: Jonnie Kind, MD;  Location: [facility];  Service: Obstetrics;  Laterality: Bilateral;  . TOOTH EXTRACTION       OB History    Gravida  2   Para  2   Term  2   Preterm      AB      Living  2     SAB      TAB      Ectopic      Multiple      Live Births  1            Home Medications    Prior to Admission medications   Medication Sig Start Date End Date Taking? Authorizing Provider  acetaminophen (TYLENOL) 500 MG tablet Take 500 mg by mouth every 6 (six) hours as needed for mild pain or headache.    [provider]  benzonatate (TESSALON) 100 MG capsule Take 1 capsule (100 mg total) by mouth every 8 (eight) hours. 08/21/15   Larene Pickett, PA-C  cyclobenzaprine (FLEXERIL) 10 MG tablet Take 1 tablet (10 mg total) by mouth 2 (two) times daily as needed for muscle spasms. Patient not taking: Reported on  02/16/2014 02/14/14   Etta Quill, NP  ferrous sulfate 300 (60 FE) MG/5ML syrup Take 5 mLs (300 mg total) by mouth 2 (two) times daily with a meal. Patient not taking: Reported on 02/16/2014 09/03/12   Martha Clan, MD  hydrochlorothiazide (HYDRODIURIL) 12.5 MG tablet Take 1 tablet (12.5 mg total) by mouth daily. Patient not taking: Reported on 02/16/2014 09/03/12   Martha Clan, MD  meloxicam (MOBIC) 7.5 MG tablet Take 1 tablet (7.5 mg total) by mouth daily for 10 days. 03/09/18 03/19/18  Suella Broad A, PA-C  ondansetron (ZOFRAN ODT) 4 MG disintegrating tablet Take 1 tablet (4 mg total) by mouth every 8 (eight) hours as needed for nausea or vomiting. 02/17/16   Joy, Shawn C, PA-C  oxyCODONE-acetaminophen (ROXICET) 5-325 MG/5ML solution Take 5-10 mLs by mouth every 4 (four) hours as needed. Patient not taking: Reported on 02/16/2014 09/03/12   Martha Clan, MD  phenol (CHLORASEPTIC) 1.4 % LIQD Use as directed 1 spray in the mouth or throat as needed for throat irritation / pain. 08/21/15   Larene Pickett, PA-C  promethazine (PHENERGAN) 25 MG tablet Take 1 tablet (25 mg total) by mouth every 6 (six) hours  as needed for nausea or vomiting. Patient not taking: Reported on 07/07/2014 02/16/14   Cleatrice Burke, PA-C  senna-docusate (SENOKOT-S) 8.6-50 MG per tablet Take 2 tablets by mouth at bedtime. Patient not taking: Reported on 02/16/2014 09/03/12   Martha Clan, MD  traMADol (ULTRAM) 50 MG tablet Take 1 tablet (50 mg total) by mouth every 6 (six) hours as needed. Patient not taking: Reported on 07/07/2014 02/16/14   Cleatrice Burke, PA-C    Family History No family history on file.  Social History Social History   Tobacco Use  . Smoking status: Former Smoker    Packs/day: 0.25    Types: Cigarettes    Last attempt to quit: 04/01/2012    Years since quitting: 5.9  . Smokeless tobacco: Never Used  Substance Use Topics  . Alcohol use: No  . Drug use: No     Allergies   Onion   Review  of Systems Review of Systems  Constitutional: Negative for chills and fever.  HENT: Positive for dental problem and ear pain. Negative for facial swelling, trouble swallowing and voice change.   Gastrointestinal: Negative for vomiting.  Musculoskeletal: Negative for neck pain and neck stiffness.  Skin: Negative for rash and wound.  Allergic/Immunologic: Negative for immunocompromised state.  Neurological: Positive for headaches.  Hematological: Negative for adenopathy.  Psychiatric/Behavioral: Negative for confusion.  All other systems reviewed and are negative.    Physical Exam Updated Vital Signs BP 136/86 (BP Location: Left Arm)   Pulse 85   Temp 99.6 F (37.6 C) (Oral)   Resp 18   SpO2 100%   Physical Exam  Constitutional: She is oriented to person, place, and time. She appears well-developed and well-nourished. No distress.  HENT:  Head: Normocephalic and atraumatic.  Mouth/Throat: Uvula is midline, oropharynx is clear and moist and mucous membranes are normal. No trismus in the jaw. Normal dentition. No dental abscesses, uvula swelling or dental caries. No oropharyngeal exudate.    Neck: Neck supple.  Pulmonary/Chest: Effort normal.  Lymphadenopathy:    She has no cervical adenopathy.  Neurological: She is alert and oriented to person, place, and time.  Skin: Skin is warm and dry. She is not diaphoretic.  Psychiatric: She has a normal mood and affect. Her behavior is normal.  Nursing note and vitals reviewed.    ED Treatments / Results  Labs (all labs ordered are listed, but only abnormal results are displayed) Labs Reviewed - No data to display  EKG None  Radiology No results found.  Procedures Procedures (including critical care time)  Medications Ordered in ED Medications - No data to display   Initial Impression / Assessment and Plan / ED Course  I have reviewed the triage vital signs and the nursing notes.  Pertinent labs & imaging results  that were available during my care of the patient were reviewed by me and considered in my medical decision making (see chart for details).  Clinical Course as of Mar 09 1728  Fri Mar 09, 2018  1726 37yo female with right lower dental pain, on exam, appears to have soft food particle between molars without obvious abscess. Given topical pain reliever, rx for meloxicam if needing more, otherwise, recommend patient floss teeth and follow up with dentist for recheck.    [LM]    Clinical Course User Index [LM] Tacy Learn, PA-C   Final Clinical Impressions(s) / ED Diagnoses   Final diagnoses:  Pain, dental    ED Discharge Orders  Ordered    meloxicam (MOBIC) 7.5 MG tablet  Daily     03/09/18 1720           Roque Lias 03/09/18 1729    Nat Christen, MD 03/10/18 [phone]

## 2018-06-21 ENCOUNTER — Other Ambulatory Visit: Payer: Self-pay

## 2018-06-21 ENCOUNTER — Emergency Department (HOSPITAL_COMMUNITY)
Admission: EM | Admit: 2018-06-21 | Discharge: 2018-06-21 | Disposition: A | Discharge status: Home / Self Care | Payer: Medicaid Other | Attending: Emergency Medicine | Admitting: Emergency Medicine

## 2018-06-21 DIAGNOSIS — K0889 Other specified disorders of teeth and supporting structures: Secondary | ICD-10-CM | POA: Diagnosis present

## 2018-06-21 DIAGNOSIS — Z87891 Personal history of nicotine dependence: Secondary | ICD-10-CM | POA: Diagnosis not present

## 2018-06-21 DIAGNOSIS — K047 Periapical abscess without sinus: Secondary | ICD-10-CM | POA: Diagnosis not present

## 2018-06-21 MED ORDER — PENICILLIN V POTASSIUM 500 MG PO TABS: 500.0000 mg | ORAL_TABLET | Freq: Four times a day (QID) | ORAL | 0 refills | Status: DC

## 2018-06-21 MED ORDER — KETOROLAC TROMETHAMINE 60 MG/2ML IM SOLN
60.0000 mg | Freq: Once | INTRAMUSCULAR | Status: AC
  Administered 2018-06-21: 60 mg via INTRAMUSCULAR
  Filled 2018-06-21: qty 2

## 2018-06-21 MED ORDER — IBUPROFEN 800 MG PO TABS: 800.0000 mg | ORAL_TABLET | Freq: Three times a day (TID) | ORAL | 0 refills | Status: DC | PRN

## 2018-06-21 MED ORDER — TRAMADOL HCL 50 MG PO TABS: 50.0000 mg | ORAL_TABLET | Freq: Four times a day (QID) | ORAL | 0 refills | Status: DC | PRN

## 2018-06-21 NOTE — ED Triage Notes (Signed)
Awakened with dental pain left upper, several broken teeth at gum line, some redness noted.

## 2018-06-21 NOTE — ED Provider Notes (Signed)
[facility] EMERGENCY DEPARTMENT Provider Note   CSN: [id number] Arrival date & time: 06/21/18  0319    History   Chief Complaint Chief Complaint  Patient presents with  . Dental Pain    HPI Katie Garrett is a 38 y.o. female.     HPI Patient presents to the emergency department with left upper dental pain.  The patient states that she had problems with this tooth for quite a while.  She states that she does not have a dentist.  Patient states that the pain started earlier today.  Patient denies fever, nausea, vomiting, neck swelling, throat swelling, tongue swelling, or syncope.  Did take some over-the-counter ibuprofen which seemed to help temporarily with her pain. Past Medical History:  Diagnosis Date  . Chlamydia   . Headache(784.0)   . Pregnancy induced hypertension   . Shortness of breath     Patient Active Problem List   Diagnosis Date Noted  . Cephalopelvic disproportion, delivered, current hospitalization 09/01/2012    Past Surgical History:  Procedure Laterality Date  . CESAREAN SECTION    . CESAREAN SECTION WITH BILATERAL TUBAL LIGATION Bilateral 09/01/2012   Procedure: CESAREAN SECTION WITH BILATERAL TUBAL LIGATION;  Surgeon: Jonnie Kind, MD;  Location: [facility];  Service: Obstetrics;  Laterality: Bilateral;  . TOOTH EXTRACTION       OB History    Gravida  2   Para  2   Term  2   Preterm      AB      Living  2     SAB      TAB      Ectopic      Multiple      Live Births  1            Home Medications    Prior to Admission medications   Medication Sig Start Date End Date Taking? Authorizing Provider  acetaminophen (TYLENOL) 500 MG tablet Take 500 mg by mouth every 6 (six) hours as needed for mild pain or headache.    [provider]  benzonatate (TESSALON) 100 MG capsule Take 1 capsule (100 mg total) by mouth every 8 (eight) hours. 08/21/15   Larene Pickett, PA-C  cyclobenzaprine  (FLEXERIL) 10 MG tablet Take 1 tablet (10 mg total) by mouth 2 (two) times daily as needed for muscle spasms. Patient not taking: Reported on 02/16/2014 02/14/14   Etta Quill, NP  ferrous sulfate 300 (60 FE) MG/5ML syrup Take 5 mLs (300 mg total) by mouth 2 (two) times daily with a meal. Patient not taking: Reported on 02/16/2014 09/03/12   Martha Clan, MD  hydrochlorothiazide (HYDRODIURIL) 12.5 MG tablet Take 1 tablet (12.5 mg total) by mouth daily. Patient not taking: Reported on 02/16/2014 09/03/12   Martha Clan, MD  ondansetron (ZOFRAN ODT) 4 MG disintegrating tablet Take 1 tablet (4 mg total) by mouth every 8 (eight) hours as needed for nausea or vomiting. 02/17/16   Joy, Shawn C, PA-C  oxyCODONE-acetaminophen (ROXICET) 5-325 MG/5ML solution Take 5-10 mLs by mouth every 4 (four) hours as needed. Patient not taking: Reported on 02/16/2014 09/03/12   Martha Clan, MD  phenol (CHLORASEPTIC) 1.4 % LIQD Use as directed 1 spray in the mouth or throat as needed for throat irritation / pain. 08/21/15   Larene Pickett, PA-C  promethazine (PHENERGAN) 25 MG tablet Take 1 tablet (25 mg total) by mouth every 6 (six) hours as needed for nausea or vomiting. Patient  not taking: Reported on 07/07/2014 02/16/14   Cleatrice Burke, PA-C  senna-docusate (SENOKOT-S) 8.6-50 MG per tablet Take 2 tablets by mouth at bedtime. Patient not taking: Reported on 02/16/2014 09/03/12   Martha Clan, MD  traMADol (ULTRAM) 50 MG tablet Take 1 tablet (50 mg total) by mouth every 6 (six) hours as needed. Patient not taking: Reported on 07/07/2014 02/16/14   Cleatrice Burke, PA-C    Family History No family history on file.  Social History Social History   Tobacco Use  . Smoking status: Former Smoker    Packs/day: 0.25    Types: Cigarettes    Last attempt to quit: 04/01/2012    Years since quitting: 6.2  . Smokeless tobacco: Never Used  Substance Use Topics  . Alcohol use: No  . Drug use: No     Allergies   Onion    Review of Systems Review of Systems All other systems negative except as documented in the HPI. All pertinent positives and negatives as reviewed in the HPI.   Physical Exam Updated Vital Signs BP (!) 165/118 (BP Location: Right Arm)   Pulse (!) 105   Temp 98.6 F (37 C) (Oral)   Resp 14   Ht 5\' 1"  (1.549 m)   Wt 65.8 kg   SpO2 98%   BMI 27.40 kg/m   Physical Exam Vitals signs and nursing note reviewed.  Constitutional:      General: She is not in acute distress.    Appearance: She is well-developed.  HENT:     Head: Normocephalic and atraumatic.     Mouth/Throat:     Mouth: Mucous membranes are moist.     Dentition: Dental tenderness and gingival swelling present. No dental caries, dental abscesses or gum lesions.  Eyes:     Pupils: Pupils are equal, round, and reactive to light.  Pulmonary:     Effort: Pulmonary effort is normal.  Skin:    General: Skin is warm and dry.  Neurological:     Mental Status: She is alert and oriented to person, place, and time.      ED Treatments / Results  Labs (all labs ordered are listed, but only abnormal results are displayed) Labs Reviewed - No data to display  EKG None  Radiology No results found.  Procedures Procedures (including critical care time)  Medications Ordered in ED Medications - No data to display   Initial Impression / Assessment and Plan / ED Course  I have reviewed the triage vital signs and the nursing notes.  Pertinent labs & imaging results that were available during my care of the patient were reviewed by me and considered in my medical decision making (see chart for details).        Patient has a severely decayed second molar that has been broken off for quite a while.  The patient has swelling along the gumline and will treat for dental infection.  Have advised her she will need to follow-up with the oral surgeon on call.  Final Clinical Impressions(s) / ED Diagnoses   Final diagnoses:   None    ED Discharge Orders    None       Dalia Heading, PA-C 06/21/18 0411    Duffy Bruce, MD 06/21/18 [phone]

## 2018-06-21 NOTE — Discharge Instructions (Addendum)
Return here as needed.  Follow-up with the oral surgeon provided.  Rinse with warm water peroxide 3 times a day.

## 2018-06-21 NOTE — ED Notes (Signed)
Pt discharged from ED; instructions provided and scripts given; Pt encouraged to return to ED if symptoms worsen and to f/u with PCP; Pt verbalized understanding of all instructions 

## 2019-07-27 ENCOUNTER — Encounter (HOSPITAL_COMMUNITY): Payer: Self-pay | Admitting: *Deleted

## 2019-07-27 ENCOUNTER — Emergency Department (HOSPITAL_COMMUNITY)
Admission: EM | Admit: 2019-07-27 | Discharge: 2019-07-28 | Disposition: A | Discharge status: Home / Self Care | Payer: Medicaid Other | Attending: Emergency Medicine | Admitting: Emergency Medicine

## 2019-07-27 ENCOUNTER — Other Ambulatory Visit: Payer: Self-pay

## 2019-07-27 DIAGNOSIS — Z87891 Personal history of nicotine dependence: Secondary | ICD-10-CM | POA: Diagnosis not present

## 2019-07-27 DIAGNOSIS — Z79899 Other long term (current) drug therapy: Secondary | ICD-10-CM | POA: Diagnosis not present

## 2019-07-27 DIAGNOSIS — K029 Dental caries, unspecified: Secondary | ICD-10-CM | POA: Insufficient documentation

## 2019-07-27 DIAGNOSIS — K0889 Other specified disorders of teeth and supporting structures: Secondary | ICD-10-CM | POA: Diagnosis present

## 2019-07-27 MED ORDER — NAPROXEN 375 MG PO TABS: 375.0000 mg | ORAL_TABLET | Freq: Two times a day (BID) | ORAL | 0 refills | Status: DC

## 2019-07-27 MED ORDER — PENICILLIN V POTASSIUM 500 MG PO TABS: 500.0000 mg | ORAL_TABLET | Freq: Four times a day (QID) | ORAL | 0 refills | Status: AC

## 2019-07-27 MED ORDER — PENICILLIN V POTASSIUM 250 MG PO TABS
500.0000 mg | ORAL_TABLET | Freq: Once | ORAL | Status: AC
  Administered 2019-07-28: 500 mg via ORAL
  Filled 2019-07-27: qty 2

## 2019-07-27 MED ORDER — NAPROXEN 250 MG PO TABS
500.0000 mg | ORAL_TABLET | Freq: Once | ORAL | Status: AC
  Administered 2019-07-28: 500 mg via ORAL
  Filled 2019-07-27: qty 2

## 2019-07-27 MED ORDER — ACETAMINOPHEN 500 MG PO TABS
1000.0000 mg | ORAL_TABLET | Freq: Once | ORAL | Status: AC
  Administered 2019-07-28: 1000 mg via ORAL
  Filled 2019-07-27: qty 2

## 2019-07-27 NOTE — ED Provider Notes (Signed)
[facility] EMERGENCY DEPARTMENT Provider Note   CSN: [id number] Arrival date & time: 07/27/19  2129     History Chief Complaint  Patient presents with  . Dental Pain    Katie Garrett is a 39 y.o. female.  The history is provided by the patient.  Dental Pain Location:  Upper Upper teeth location:  14/LU 1st molar Quality:  Aching Severity:  Severe Onset quality:  Gradual Timing:  Constant Progression:  Unchanged Chronicity:  New Context: not dental fracture   Previous work-up:  Dental exam Relieved by:  Nothing Worsened by:  Nothing Ineffective treatments:  None tried Associated symptoms: no congestion, no fever and no neck swelling   Risk factors: no diabetes        Past Medical History:  Diagnosis Date  . Chlamydia   . Headache(784.0)   . Pregnancy induced hypertension   . Shortness of breath     Patient Active Problem List   Diagnosis Date Noted  . Cephalopelvic disproportion, delivered, current hospitalization 09/01/2012    Past Surgical History:  Procedure Laterality Date  . CESAREAN SECTION    . CESAREAN SECTION WITH BILATERAL TUBAL LIGATION Bilateral 09/01/2012   Procedure: CESAREAN SECTION WITH BILATERAL TUBAL LIGATION;  Surgeon: Jonnie Kind, MD;  Location: [facility];  Service: Obstetrics;  Laterality: Bilateral;  . TOOTH EXTRACTION       OB History    Gravida  2   Para  2   Term  2   Preterm      AB      Living  2     SAB      TAB      Ectopic      Multiple      Live Births  1           History reviewed. No pertinent family history.  Social History   Tobacco Use  . Smoking status: Former Smoker    Packs/day: 0.25    Types: Cigarettes    Quit date: 04/01/2012    Years since quitting: 7.3  . Smokeless tobacco: Never Used  Substance Use Topics  . Alcohol use: No  . Drug use: No    Home Medications Prior to Admission medications   Medication Sig Start Date End Date Taking?  Authorizing Provider  acetaminophen (TYLENOL) 500 MG tablet Take 500 mg by mouth every 6 (six) hours as needed for mild pain or headache.    [provider]  benzonatate (TESSALON) 100 MG capsule Take 1 capsule (100 mg total) by mouth every 8 (eight) hours. 08/21/15   Larene Pickett, PA-C  cyclobenzaprine (FLEXERIL) 10 MG tablet Take 1 tablet (10 mg total) by mouth 2 (two) times daily as needed for muscle spasms. Patient not taking: Reported on 02/16/2014 02/14/14   Etta Quill, NP  ferrous sulfate 300 (60 FE) MG/5ML syrup Take 5 mLs (300 mg total) by mouth 2 (two) times daily with a meal. Patient not taking: Reported on 02/16/2014 09/03/12   Martha Clan, MD  hydrochlorothiazide (HYDRODIURIL) 12.5 MG tablet Take 1 tablet (12.5 mg total) by mouth daily. Patient not taking: Reported on 02/16/2014 09/03/12   Martha Clan, MD  ibuprofen (ADVIL,MOTRIN) 800 MG tablet Take 1 tablet (800 mg total) by mouth every 8 (eight) hours as needed. 06/21/18   Lawyer, Harrell Gave, PA-C  ondansetron (ZOFRAN ODT) 4 MG disintegrating tablet Take 1 tablet (4 mg total) by mouth every 8 (eight) hours as needed for nausea or  vomiting. 02/17/16   Joy, Shawn C, PA-C  oxyCODONE-acetaminophen (ROXICET) 5-325 MG/5ML solution Take 5-10 mLs by mouth every 4 (four) hours as needed. Patient not taking: Reported on 02/16/2014 09/03/12   Martha Clan, MD  penicillin v potassium (VEETID) 500 MG tablet Take 1 tablet (500 mg total) by mouth 4 (four) times daily. 06/21/18   Lawyer, Harrell Gave, PA-C  phenol (CHLORASEPTIC) 1.4 % LIQD Use as directed 1 spray in the mouth or throat as needed for throat irritation / pain. 08/21/15   Larene Pickett, PA-C  promethazine (PHENERGAN) 25 MG tablet Take 1 tablet (25 mg total) by mouth every 6 (six) hours as needed for nausea or vomiting. Patient not taking: Reported on 07/07/2014 02/16/14   Cleatrice Burke, PA-C  senna-docusate (SENOKOT-S) 8.6-50 MG per tablet Take 2 tablets by mouth at  bedtime. Patient not taking: Reported on 02/16/2014 09/03/12   Martha Clan, MD  traMADol (ULTRAM) 50 MG tablet Take 1 tablet (50 mg total) by mouth every 6 (six) hours as needed for severe pain. 06/21/18   Lawyer, Harrell Gave, PA-C    Allergies    Onion  Review of Systems   Review of Systems  Constitutional: Negative for fever.  HENT: Positive for dental problem. Negative for congestion.   Eyes: Negative for visual disturbance.  Respiratory: Negative for shortness of breath.   Cardiovascular: Negative for chest pain.  Gastrointestinal: Negative for abdominal pain.  Genitourinary: Negative for difficulty urinating.  Musculoskeletal: Negative for arthralgias.  Neurological: Negative for dizziness.  Psychiatric/Behavioral: Negative for agitation.  All other systems reviewed and are negative.   Physical Exam Updated Vital Signs BP (!) 151/102 (BP Location: Right Arm)   Pulse 97   Temp 98.8 F (37.1 C)   Resp 16   Ht 5\' 1"  (1.549 m)   Wt 68 kg   LMP 07/22/2019   SpO2 100%   BMI 28.34 kg/m   Physical Exam Constitutional:      Appearance: Normal appearance. She is not ill-appearing.  HENT:     Head: Normocephalic and atraumatic.     Nose: Nose normal.     Mouth/Throat:     Comments: Dental caries with ellis 2 fracture  Eyes:     Conjunctiva/sclera: Conjunctivae normal.     Pupils: Pupils are equal, round, and reactive to light.  Cardiovascular:     Rate and Rhythm: Normal rate and regular rhythm.     Pulses: Normal pulses.     Heart sounds: Normal heart sounds.  Pulmonary:     Effort: Pulmonary effort is normal.     Breath sounds: Normal breath sounds.  Abdominal:     General: Abdomen is flat. Bowel sounds are normal.     Tenderness: There is no abdominal tenderness.  Musculoskeletal:        General: Normal range of motion.     Cervical back: Normal range of motion and neck supple.  Lymphadenopathy:     Cervical: No cervical adenopathy.  Skin:    General: Skin  is warm and dry.     Capillary Refill: Capillary refill takes less than 2 seconds.  Neurological:     General: No focal deficit present.     Mental Status: She is alert and oriented to person, place, and time.  Psychiatric:        Mood and Affect: Mood normal.        Behavior: Behavior normal.     ED Results / Procedures / Treatments   Labs (all labs  ordered are listed, but only abnormal results are displayed) Labs Reviewed - No data to display  EKG None  Radiology No results found.  Procedures Procedures (including critical care time)  Medications Ordered in ED Medications  penicillin v potassium (VEETID) tablet 500 mg (has no administration in time range)  naproxen (NAPROSYN) tablet 500 mg (has no administration in time range)  acetaminophen (TYLENOL) tablet 1,000 mg (has no administration in time range)    ED Course  I have reviewed the triage vital signs and the nursing notes.  Pertinent labs & imaging results that were available during my care of the patient were reviewed by me and considered in my medical decision making (see chart for details).    Well appearing.  No signs of systemic infection.  Will send NSAIDS and PCN to pharmacy.   Tykira L Tisdale-Johnson was evaluated in Emergency Department on 07/27/2019 for the symptoms described in the history of present illness. She was evaluated in the context of the global COVID-19 pandemic, which necessitated consideration that the patient might be at risk for infection with the SARS-CoV-2 virus that causes COVID-19. Institutional protocols and algorithms that pertain to the evaluation of patients at risk for COVID-19 are in a state of rapid change based on information released by regulatory bodies including the CDC and federal and state organizations. These policies and algorithms were followed during the patient's care in the ED.  Final Clinical Impression(s) / ED Diagnoses Return for weakness, numbness, changes in  vision or speech, fevers >100.4 unrelieved by medication, shortness of breath, intractable vomiting, or diarrhea, abdominal pain, Inability to tolerate liquids or food, cough, altered mental status or any concerns. No signs of systemic illness or infection. The patient is nontoxic-appearing on exam and vital signs are within normal limits.   I have reviewed the triage vital signs and the nursing notes. Pertinent labs &imaging results that were available during my care of the patient were reviewed by me and considered in my medical decision making (see chart for details).  After history, exam, and medical workup I feel the patient has been appropriately medically screened and is safe for discharge home. Pertinent diagnoses were discussed with the patient. Patient was givenstrictreturn precautions.    Brizza Nathanson, MD 07/27/19 2347

## 2019-07-27 NOTE — ED Triage Notes (Signed)
Pt having Lsided upper and lower dental pain. Tried warm compressess and gargling salt water with no relief.

## 2019-07-28 NOTE — ED Notes (Signed)
Pt verbalized understanding of d/c instructions, follow up and mediations. No further questions, pt ambulatory to waiting area. NAD.

## 2020-03-18 ENCOUNTER — Emergency Department (HOSPITAL_COMMUNITY)
Admission: EM | Admit: 2020-03-18 | Discharge: 2020-03-19 | Disposition: A | Discharge status: Home / Self Care | Payer: Medicaid Other | Attending: Emergency Medicine | Admitting: Emergency Medicine

## 2020-03-18 ENCOUNTER — Other Ambulatory Visit: Payer: Self-pay

## 2020-03-18 ENCOUNTER — Encounter (HOSPITAL_COMMUNITY): Payer: Self-pay | Admitting: Pediatrics

## 2020-03-18 DIAGNOSIS — Z5321 Procedure and treatment not carried out due to patient leaving prior to being seen by health care provider: Secondary | ICD-10-CM | POA: Insufficient documentation

## 2020-03-18 DIAGNOSIS — R519 Headache, unspecified: Secondary | ICD-10-CM | POA: Insufficient documentation

## 2020-03-18 DIAGNOSIS — R110 Nausea: Secondary | ICD-10-CM | POA: Insufficient documentation

## 2020-03-18 MED ORDER — IBUPROFEN 400 MG PO TABS
400.0000 mg | ORAL_TABLET | Freq: Once | ORAL | Status: AC | PRN
  Administered 2020-03-18: 400 mg via ORAL
  Filled 2020-03-18: qty 1

## 2020-03-18 NOTE — ED Triage Notes (Signed)
C/O headache since Monday, some relief from OTC meds; but headache is persistent. Patient endorsed she is now nauseated from pain.

## 2020-03-19 ENCOUNTER — Other Ambulatory Visit: Payer: Self-pay

## 2020-03-19 ENCOUNTER — Encounter (HOSPITAL_COMMUNITY): Payer: Self-pay | Admitting: Emergency Medicine

## 2020-03-19 ENCOUNTER — Emergency Department (HOSPITAL_COMMUNITY)
Admission: EM | Admit: 2020-03-19 | Discharge: 2020-03-20 | Disposition: A | Discharge status: Home / Self Care | Payer: Medicaid Other | Attending: Emergency Medicine | Admitting: Emergency Medicine

## 2020-03-19 DIAGNOSIS — Z5321 Procedure and treatment not carried out due to patient leaving prior to being seen by health care provider: Secondary | ICD-10-CM | POA: Insufficient documentation

## 2020-03-19 DIAGNOSIS — H5710 Ocular pain, unspecified eye: Secondary | ICD-10-CM | POA: Insufficient documentation

## 2020-03-19 DIAGNOSIS — R040 Epistaxis: Secondary | ICD-10-CM | POA: Insufficient documentation

## 2020-03-19 DIAGNOSIS — R519 Headache, unspecified: Secondary | ICD-10-CM | POA: Diagnosis present

## 2020-03-19 NOTE — ED Triage Notes (Signed)
Patient with headaceh since Monday, was seen last night, had some relief with OTC, but comes back stronger.  She states that now she is having some nosebleed.  She has eye pain.

## 2020-03-20 ENCOUNTER — Ambulatory Visit
Admission: EM | Admit: 2020-03-20 | Discharge: 2020-03-20 | Disposition: A | Discharge status: Home / Self Care | Payer: Medicaid Other | Attending: Urgent Care | Admitting: Urgent Care

## 2020-03-20 DIAGNOSIS — I10 Essential (primary) hypertension: Secondary | ICD-10-CM

## 2020-03-20 DIAGNOSIS — R519 Headache, unspecified: Secondary | ICD-10-CM

## 2020-03-20 DIAGNOSIS — R030 Elevated blood-pressure reading, without diagnosis of hypertension: Secondary | ICD-10-CM

## 2020-03-20 MED ORDER — AMLODIPINE BESYLATE 10 MG PO TABS: 10.0000 mg | ORAL_TABLET | Freq: Every day | ORAL | 0 refills | Status: DC

## 2020-03-20 MED ORDER — ACETAMINOPHEN 325 MG PO TABS
650.0000 mg | ORAL_TABLET | Freq: Once | ORAL | Status: AC
  Administered 2020-03-20: 650 mg via ORAL

## 2020-03-20 NOTE — ED Triage Notes (Signed)
Pt c/o headache x3 day with nose bleeds and eye pain. States went to ED x2 and never saw a provider. States hasn't been on her b/p meds for over 45yrs.

## 2020-03-20 NOTE — ED Provider Notes (Signed)
[place]   MRN: [id number] DOB: Dec 20, 1980  Subjective:   Katie Garrett is a 39 y.o. female presenting for 3-day history of acute onset recurrent severe headaches.  Has also had some bilateral eye pain and slight nosebleeds.  She has gone to the emergency room twice but left without being seen.  Has a history of hypertension but is not on any medications for this now.  She was on medicine for this for preeclampsia when she was pregnant in 2014.  Has not been on it since then.  Denies history of heart disease, stroke.  Has been using [place] for her headaches without relief.  Denies confusion, weakness, vision change, chest pain, belly pain, hematuria.  No current facility-administered medications for this encounter.  Current Outpatient Medications:    acetaminophen (TYLENOL) 500 MG tablet, Take 500 mg by mouth every 6 (six) hours as needed for mild pain or headache., Disp: , Rfl:    hydrochlorothiazide (HYDRODIURIL) 12.5 MG tablet, Take 1 tablet (12.5 mg total) by mouth daily. (Patient not taking: Reported on 02/16/2014), Disp: 30 tablet, Rfl: 1   ibuprofen (ADVIL,MOTRIN) 800 MG tablet, Take 1 tablet (800 mg total) by mouth every 8 (eight) hours as needed., Disp: 21 tablet, Rfl: 0   Allergies  Allergen Reactions   Onion Anaphylaxis and Swelling    Past Medical History:  Diagnosis Date   Chlamydia    Headache(784.0)    Pregnancy induced hypertension    Shortness of breath      Past Surgical History:  Procedure Laterality Date   CESAREAN SECTION     CESAREAN SECTION WITH BILATERAL TUBAL LIGATION Bilateral 09/01/2012   Procedure: CESAREAN SECTION WITH BILATERAL TUBAL LIGATION;  Surgeon: Jonnie Kind, MD;  Location: [facility];  Service: Obstetrics;  Laterality: Bilateral;   TOOTH EXTRACTION      No family history on file.  Social History   Tobacco Use   Smoking status: Former Smoker    Packs/day: 0.25    Types: Cigarettes    Quit date:  04/01/2012    Years since quitting: 7.9   Smokeless tobacco: Never Used  Vaping Use   Vaping Use: Never used  Substance Use Topics   Alcohol use: No   Drug use: No    ROS   Objective:   Vitals: BP (!) 162/116 (BP Location: Left Arm)    Pulse 100    Temp 98.1 F (36.7 C) (Oral)    Resp 20    LMP 03/08/2020    SpO2 98%   BP 159/100 on recheck by PA-Derry Kassel.   Physical Exam Constitutional:      General: She is not in acute distress.    Appearance: Normal appearance. She is well-developed. She is obese. She is not ill-appearing, toxic-appearing or diaphoretic.  HENT:     Head: Normocephalic and atraumatic.     Right Ear: External ear normal.     Left Ear: External ear normal.     Nose: Nose normal.     Mouth/Throat:     Mouth: Mucous membranes are moist.     Pharynx: Oropharynx is clear.  Eyes:     General: No scleral icterus.       Right eye: No discharge.        Left eye: No discharge.     Extraocular Movements: Extraocular movements intact.     Conjunctiva/sclera: Conjunctivae normal.     Pupils: Pupils are equal, round, and reactive to light.  Cardiovascular:  Rate and Rhythm: Normal rate and regular rhythm.     Pulses: Normal pulses.     Heart sounds: Normal heart sounds. No murmur heard. No friction rub. No gallop.   Pulmonary:     Effort: Pulmonary effort is normal. No respiratory distress.     Breath sounds: Normal breath sounds. No stridor. No wheezing, rhonchi or rales.  Skin:    General: Skin is warm and dry.     Findings: No rash.  Neurological:     General: No focal deficit present.     Mental Status: She is alert and oriented to person, place, and time.     Cranial Nerves: No cranial nerve deficit.     Motor: No weakness.     Coordination: Coordination normal.     Gait: Gait normal.     Deep Tendon Reflexes: Reflexes normal.     Comments: Negative Romberg and pronator drift.  Psychiatric:        Mood and Affect: Mood normal.        Behavior:  Behavior normal.        Thought Content: Thought content normal.        Judgment: Judgment normal.      Assessment and Plan :   PDMP not reviewed this encounter.  1. Essential hypertension   2. Elevated blood pressure reading   3. Acute nonintractable headache, unspecified headache type     Counseled patient on the risks of severe headache in the setting of uncontrolled hypertension including stroke.  Patient has not been successful trying to get seen at the hospital and therefore counseled that we could start her on amlodipine with strict ER precautions as she does not currently have signs of an acute intracranial process.  She does have a family history of hypertension without stroke or heart attacks.  Emphasized need to establish care through [facility] internal medicine for new PCP.  Will otherwise have patient follow-up here in 1 week for blood pressure recheck and overall reevaluation. Counseled patient on potential for adverse effects with medications prescribed/recommended today, ER and return-to-clinic precautions discussed, patient verbalized understanding.    Jaynee Eagles, [place] 03/20/20 [phone]

## 2020-03-20 NOTE — Discharge Instructions (Addendum)
For amlodipine - start taking 1/2 tablet once daily through the weekend. On Monday, start taking a full tablet. Come back with [place] for a recheck on your blood pressure and headache if you're unable to get in with a new PCP. If your headache and you become confused, have vision changes, weakness on one side of your body then please call 911 or have a calm family member/friend drive you to the hospital.   Please just use Tylenol at a dose of 500mg -650mg  once every 6 hours as needed for your aches, pains, fevers. Do not use any nonsteroidal anti-inflammatories (NSAIDs) like ibuprofen, Motrin, naproxen, Aleve, etc. which are all available over-the-counter.    For diabetes or elevated blood sugar, please make sure you are limiting and avoiding starchy, carbohydrate foods like pasta, breads, sweet breads, pastry, rice, potatoes, desserts. These foods can elevate your blood sugar. Also, limit and avoid drinks that contain a lot of sugar such as sodas, sweet teas, fruit juices.  Drinking plain water will be much more helpful, try 64 ounces of water daily.  It is okay to flavor your water naturally by cutting cucumber, lemon, mint or lime, placing it in a picture with water and drinking it over a period of 24-48 hours as long as it remains refrigerated.  For elevated blood pressure, make sure you are monitoring salt in your diet.  Do not eat restaurant foods and limit processed foods at home. I highly recommend you prepare and cook your own foods at home.  Processed foods include things like frozen meals, pre-seasoned meats and dinners, deli meats, canned foods as these foods contain a high amount of sodium/salt.  Make sure you are paying attention to sodium labels on foods you buy at the grocery store. Buy your spices separately such as garlic powder, onion powder, cumin, cayenne, parsley flakes so that you can avoid seasonings that contain salt. However, salt-free seasonings are available and can be used, an example  is Mrs. Dash and includes a lot of different mixtures that do not contain salt.  Lastly, when cooking using oils that are healthier for you is important. This includes olive oil, avocado oil, canola oil. We have discussed a lot of foods to avoid but below is a list of foods that can be very healthy to use in your diet whether it is for diabetes, cholesterol, high blood pressure, or in general healthy eating.  Salads - kale, spinach, cabbage, spring mix, arugula Fruits - avocadoes, berries (blueberries, raspberries, blackberries), apples, oranges, pomegranate, grapefruit, kiwi Vegetables - asparagus, cauliflower, broccoli, green beans, brussel sprouts, bell peppers, beets; stay away from or limit starchy vegetables like potatoes, carrots, peas Other general foods - kidney beans, egg whites, almonds, walnuts, sunflower seeds, pumpkin seeds, fat free yogurt, almond milk, flax seeds, quinoa, oats  Meat - It is better to eat lean meats and limit your red meat including pork to once a week.  Wild caught fish, chicken breast are good options as they tend to be leaner sources of good protein. Still be mindful of the sodium labels for the meats you buy.  DO NOT EAT ANY FOODS ON THIS LIST THAT YOU ARE ALLERGIC TO. For more specific needs, I highly recommend consulting a dietician or nutritionist but this can definitely be a good starting point.

## 2020-03-29 ENCOUNTER — Emergency Department (HOSPITAL_COMMUNITY)
Admission: EM | Admit: 2020-03-29 | Discharge: 2020-03-29 | Disposition: A | Discharge status: Home / Self Care | Payer: Medicaid Other | Attending: Emergency Medicine | Admitting: Emergency Medicine

## 2020-03-29 ENCOUNTER — Emergency Department (HOSPITAL_COMMUNITY): Payer: Medicaid Other

## 2020-03-29 ENCOUNTER — Other Ambulatory Visit: Payer: Self-pay

## 2020-03-29 ENCOUNTER — Encounter (HOSPITAL_COMMUNITY): Payer: Self-pay

## 2020-03-29 DIAGNOSIS — A599 Trichomoniasis, unspecified: Secondary | ICD-10-CM | POA: Diagnosis not present

## 2020-03-29 DIAGNOSIS — D259 Leiomyoma of uterus, unspecified: Secondary | ICD-10-CM | POA: Diagnosis not present

## 2020-03-29 DIAGNOSIS — Z87891 Personal history of nicotine dependence: Secondary | ICD-10-CM | POA: Insufficient documentation

## 2020-03-29 DIAGNOSIS — R109 Unspecified abdominal pain: Secondary | ICD-10-CM | POA: Diagnosis present

## 2020-03-29 DIAGNOSIS — R1900 Intra-abdominal and pelvic swelling, mass and lump, unspecified site: Secondary | ICD-10-CM

## 2020-03-29 LAB — CBC
HCT: 27.4 % — ABNORMAL LOW (ref 36.0–46.0)
Hemoglobin: 7.6 g/dL — ABNORMAL LOW (ref 12.0–15.0)
MCH: 17.2 pg — ABNORMAL LOW (ref 26.0–34.0)
MCHC: 27.7 g/dL — ABNORMAL LOW (ref 30.0–36.0)
MCV: 62.1 fL — ABNORMAL LOW (ref 80.0–100.0)
Platelets: 522 10*3/uL — ABNORMAL HIGH (ref 150–400)
RBC: 4.41 MIL/uL (ref 3.87–5.11)
RDW: 20.1 % — ABNORMAL HIGH (ref 11.5–15.5)
WBC: 14.9 10*3/uL — ABNORMAL HIGH (ref 4.0–10.5)
nRBC: 0 % (ref 0.0–0.2)

## 2020-03-29 LAB — CBC WITH DIFFERENTIAL/PLATELET
Abs Immature Granulocytes: 0.06 10*3/uL (ref 0.00–0.07)
Basophils Absolute: 0.1 10*3/uL (ref 0.0–0.1)
Basophils Relative: 0 %
Eosinophils Absolute: 0.1 10*3/uL (ref 0.0–0.5)
Eosinophils Relative: 1 %
HCT: 28.3 % — ABNORMAL LOW (ref 36.0–46.0)
Hemoglobin: 8 g/dL — ABNORMAL LOW (ref 12.0–15.0)
Immature Granulocytes: 0 %
Lymphocytes Relative: 17 %
Lymphs Abs: 2.4 10*3/uL (ref 0.7–4.0)
MCH: 17.4 pg — ABNORMAL LOW (ref 26.0–34.0)
MCHC: 28.3 g/dL — ABNORMAL LOW (ref 30.0–36.0)
MCV: 61.4 fL — ABNORMAL LOW (ref 80.0–100.0)
Monocytes Absolute: 1.1 10*3/uL — ABNORMAL HIGH (ref 0.1–1.0)
Monocytes Relative: 8 %
Neutro Abs: 10.6 10*3/uL — ABNORMAL HIGH (ref 1.7–7.7)
Neutrophils Relative %: 74 %
Platelets: 549 10*3/uL — ABNORMAL HIGH (ref 150–400)
RBC: 4.61 MIL/uL (ref 3.87–5.11)
RDW: 20.4 % — ABNORMAL HIGH (ref 11.5–15.5)
WBC: 14.3 10*3/uL — ABNORMAL HIGH (ref 4.0–10.5)
nRBC: 0 % (ref 0.0–0.2)

## 2020-03-29 LAB — COMPREHENSIVE METABOLIC PANEL
ALT: 14 U/L (ref 0–44)
AST: 17 U/L (ref 15–41)
Albumin: 3.4 g/dL — ABNORMAL LOW (ref 3.5–5.0)
Alkaline Phosphatase: 72 U/L (ref 38–126)
Anion gap: 10 (ref 5–15)
BUN: 10 mg/dL (ref 6–20)
CO2: 24 mmol/L (ref 22–32)
Calcium: 8.7 mg/dL — ABNORMAL LOW (ref 8.9–10.3)
Chloride: 103 mmol/L (ref 98–111)
Creatinine, Ser: 0.84 mg/dL (ref 0.44–1.00)
GFR, Estimated: >60 mL/min (ref >60)
Glucose, Bld: 102 mg/dL — ABNORMAL HIGH (ref 70–99)
Potassium: 2.8 mmol/L — ABNORMAL LOW (ref 3.5–5.1)
Sodium: 137 mmol/L (ref 135–145)
Total Bilirubin: 0.4 mg/dL (ref 0.3–1.2)
Total Protein: 7.7 g/dL (ref 6.5–8.1)

## 2020-03-29 LAB — URINALYSIS, ROUTINE W REFLEX MICROSCOPIC
Bilirubin Urine: NEGATIVE
Glucose, UA: NEGATIVE mg/dL
Ketones, ur: NEGATIVE mg/dL
Nitrite: NEGATIVE
Protein, ur: NEGATIVE mg/dL
Specific Gravity, Urine: 1.03 (ref 1.005–1.030)
pH: 7 (ref 5.0–8.0)

## 2020-03-29 LAB — I-STAT BETA HCG BLOOD, ED (MC, WL, AP ONLY): I-stat hCG, quantitative: <5 m[IU]/mL (ref <5)

## 2020-03-29 LAB — WET PREP, GENITAL
Sperm: NONE SEEN
Yeast Wet Prep HPF POC: NONE SEEN

## 2020-03-29 LAB — LIPASE, BLOOD: Lipase: 20 U/L (ref 11–51)

## 2020-03-29 MED ORDER — DOXYCYCLINE HYCLATE 100 MG PO TABS
100.0000 mg | ORAL_TABLET | Freq: Once | ORAL | Status: AC
  Administered 2020-03-29: 13:00:00 100 mg via ORAL
  Filled 2020-03-29: qty 1

## 2020-03-29 MED ORDER — METRONIDAZOLE 500 MG PO TABS
2000.0000 mg | ORAL_TABLET | Freq: Once | ORAL | Status: AC
  Administered 2020-03-29: 13:00:00 2000 mg via ORAL
  Filled 2020-03-29: qty 4

## 2020-03-29 MED ORDER — DOXYCYCLINE HYCLATE 100 MG PO CAPS: 100.0000 mg | ORAL_CAPSULE | Freq: Two times a day (BID) | ORAL | 0 refills | Status: AC

## 2020-03-29 MED ORDER — OXYCODONE HCL 5 MG PO TABS: 5.0000 mg | ORAL_TABLET | Freq: Four times a day (QID) | ORAL | 0 refills | Status: DC | PRN

## 2020-03-29 MED ORDER — CEFTRIAXONE SODIUM 1 G IJ SOLR
500.0000 mg | Freq: Once | INTRAMUSCULAR | Status: AC
  Administered 2020-03-29: 13:00:00 500 mg via INTRAMUSCULAR
  Filled 2020-03-29: qty 10

## 2020-03-29 MED ORDER — FENTANYL CITRATE (PF) 100 MCG/2ML IJ SOLN
50.0000 ug | Freq: Once | INTRAMUSCULAR | Status: AC
  Administered 2020-03-29: 50 ug via INTRAVENOUS
  Filled 2020-03-29: qty 2

## 2020-03-29 MED ORDER — POTASSIUM CHLORIDE 10 MEQ/100ML IV SOLN
10.0000 meq | Freq: Once | INTRAVENOUS | Status: AC
  Administered 2020-03-29: 10 meq via INTRAVENOUS
  Filled 2020-03-29: qty 100

## 2020-03-29 MED ORDER — STERILE WATER FOR INJECTION IJ SOLN
INTRAMUSCULAR | Status: AC
  Administered 2020-03-29: 13:00:00 2.1 mL
  Filled 2020-03-29: qty 10

## 2020-03-29 MED ORDER — POTASSIUM CHLORIDE CRYS ER 20 MEQ PO TBCR
40.0000 meq | EXTENDED_RELEASE_TABLET | Freq: Once | ORAL | Status: AC
  Administered 2020-03-29: 40 meq via ORAL
  Filled 2020-03-29: qty 2

## 2020-03-29 MED ORDER — IOHEXOL 300 MG/ML  SOLN
100.0000 mL | Freq: Once | INTRAMUSCULAR | Status: AC | PRN
  Administered 2020-03-29: 100 mL via INTRAVENOUS

## 2020-03-29 MED ORDER — MORPHINE SULFATE (PF) 4 MG/ML IV SOLN
4.0000 mg | Freq: Once | INTRAVENOUS | Status: AC
  Administered 2020-03-29: 4 mg via INTRAVENOUS
  Filled 2020-03-29: qty 1

## 2020-03-29 MED ORDER — SODIUM CHLORIDE 0.9 % IV BOLUS
1000.0000 mL | Freq: Once | INTRAVENOUS | Status: AC
  Administered 2020-03-29: 1000 mL via INTRAVENOUS

## 2020-03-29 NOTE — ED Triage Notes (Signed)
Patient arrived with complaints of left flank pain that worsens with movement that started yesterday. No complaints of urinary symptoms.

## 2020-03-29 NOTE — ED Provider Notes (Signed)
[facility] DEPT Provider Note   CSN: [id number] Arrival date & time: 03/29/20  0542     History Chief Complaint  Patient presents with   Flank Pain    Katie Garrett is a 39 y.o. female.  The history is provided by the patient.  Flank Pain This is a new problem. The current episode started 2 days ago. The problem occurs constantly. The problem has not changed since onset.Associated symptoms include abdominal pain. Pertinent negatives include no chest pain, no headaches and no shortness of breath. Nothing aggravates the symptoms. Nothing relieves the symptoms. She has tried nothing for the symptoms. The treatment provided no relief.       Past Medical History:  Diagnosis Date   Chlamydia    Headache(784.0)    Pregnancy induced hypertension    Shortness of breath     Patient Active Problem List   Diagnosis Date Noted   Cephalopelvic disproportion, delivered, current hospitalization 09/01/2012    Past Surgical History:  Procedure Laterality Date   CESAREAN SECTION     CESAREAN SECTION WITH BILATERAL TUBAL LIGATION Bilateral 09/01/2012   Procedure: CESAREAN SECTION WITH BILATERAL TUBAL LIGATION;  Surgeon: Jonnie Kind, MD;  Location: [facility];  Service: Obstetrics;  Laterality: Bilateral;   TOOTH EXTRACTION       OB History    Gravida  2   Para  2   Term  2   Preterm      AB      Living  2     SAB      IAB      Ectopic      Multiple      Live Births  1           No family history on file.  Social History   Tobacco Use   Smoking status: Former Smoker    Packs/day: 0.25    Types: Cigarettes    Quit date: 04/01/2012    Years since quitting: 7.9   Smokeless tobacco: Never Used  Vaping Use   Vaping Use: Never used  Substance Use Topics   Alcohol use: No   Drug use: No    Home Medications Prior to Admission medications   Medication Sig Start Date End Date Taking? Authorizing  Provider  amLODipine (NORVASC) 10 MG tablet Take 1 tablet (10 mg total) by mouth daily. 03/20/20  Yes Jaynee Eagles, PA-C  Aspirin-Acetaminophen-Caffeine (GOODY HEADACHE PO) Take 1 packet by mouth every 8 (eight) hours as needed (headache/pain).   Yes [provider]  doxycycline (VIBRAMYCIN) 100 MG capsule Take 1 capsule (100 mg total) by mouth 2 (two) times daily for 7 days. 03/29/20 04/05/20  Eryc Bodey, DO  hydrochlorothiazide (HYDRODIURIL) 12.5 MG tablet Take 1 tablet (12.5 mg total) by mouth daily. Patient not taking: No sig reported 09/03/12   Martha Clan, MD  ibuprofen (ADVIL,MOTRIN) 800 MG tablet Take 1 tablet (800 mg total) by mouth every 8 (eight) hours as needed. Patient not taking: Reported on 03/29/2020 06/21/18   Dalia Heading, PA-C  oxyCODONE (ROXICODONE) 5 MG immediate release tablet Take 1 tablet (5 mg total) by mouth every 6 (six) hours as needed for up to 10 doses for severe pain. 03/29/20   Kamaree Wheatley, DO    Allergies    Onion  Review of Systems   Review of Systems  Constitutional: Negative for chills and fever.  HENT: Negative for ear pain and sore throat.   Eyes: Negative for  pain and visual disturbance.  Respiratory: Negative for cough and shortness of breath.   Cardiovascular: Negative for chest pain and palpitations.  Gastrointestinal: Positive for abdominal pain and constipation. Negative for nausea and vomiting.  Genitourinary: Positive for flank pain. Negative for decreased urine volume, difficulty urinating, dysuria, frequency, genital sores, hematuria, menstrual problem, pelvic pain, urgency, vaginal bleeding, vaginal discharge and vaginal pain.  Musculoskeletal: Negative for arthralgias and back pain.  Skin: Negative for color change and rash.  Neurological: Negative for seizures, syncope and headaches.  All other systems reviewed and are negative.   Physical Exam Updated Vital Signs BP 121/86    Pulse (!) 111    Temp 98.6 F (37 C)  (Oral)    Resp 16    Ht 5\' 1"  (1.549 m)    Wt 70.3 kg    LMP 03/08/2020    SpO2 100%    BMI 29.29 kg/m   Physical Exam Vitals and nursing note reviewed.  Constitutional:      General: She is not in acute distress.    Appearance: She is well-developed and well-nourished. She is not ill-appearing.  HENT:     Head: Normocephalic and atraumatic.     Mouth/Throat:     Mouth: Mucous membranes are moist.  Eyes:     Extraocular Movements: Extraocular movements intact.     Conjunctiva/sclera: Conjunctivae normal.     Pupils: Pupils are equal, round, and reactive to light.  Cardiovascular:     Rate and Rhythm: Normal rate and regular rhythm.     Pulses: Normal pulses.     Heart sounds: Normal heart sounds. No murmur heard.   Pulmonary:     Effort: Pulmonary effort is normal. No respiratory distress.     Breath sounds: Normal breath sounds.  Abdominal:     Palpations: Abdomen is soft.     Tenderness: There is abdominal tenderness (TTP in LLQ/LUQ).  Musculoskeletal:        General: No edema.     Cervical back: Normal range of motion and neck supple.  Skin:    General: Skin is warm and dry.     Capillary Refill: Capillary refill takes less than 2 seconds.  Neurological:     General: No focal deficit present.     Mental Status: She is alert.  Psychiatric:        Mood and Affect: Mood and affect and mood normal.     ED Results / Procedures / Treatments   Labs (all labs ordered are listed, but only abnormal results are displayed) Labs Reviewed  WET PREP, GENITAL - Abnormal; Notable for the following components:      Result Value   Trich, Wet Prep PRESENT (*)    Clue Cells Wet Prep HPF POC PRESENT (*)    WBC, Wet Prep HPF POC FEW (*)    All other components within normal limits  URINALYSIS, ROUTINE W REFLEX MICROSCOPIC - Abnormal; Notable for the following components:   Color, Urine STRAW (*)    APPearance HAZY (*)    Hgb urine dipstick MODERATE (*)    Leukocytes,Ua LARGE (*)     Bacteria, UA RARE (*)    All other components within normal limits  CBC WITH DIFFERENTIAL/PLATELET - Abnormal; Notable for the following components:   WBC 14.3 (*)    Hemoglobin 8.0 (*)    HCT 28.3 (*)    MCV 61.4 (*)    MCH 17.4 (*)    MCHC 28.3 (*)  RDW 20.4 (*)    Platelets 549 (*)    Neutro Abs 10.6 (*)    Monocytes Absolute 1.1 (*)    All other components within normal limits  COMPREHENSIVE METABOLIC PANEL - Abnormal; Notable for the following components:   Potassium 2.8 (*)    Glucose, Bld 102 (*)    Calcium 8.7 (*)    Albumin 3.4 (*)    All other components within normal limits  LIPASE, BLOOD  CBC  I-STAT BETA HCG BLOOD, ED ([facility], [facility], [facility] ONLY)  GC/CHLAMYDIA PROBE AMP ([place]) NOT AT [facility]    EKG None  Radiology CT ABDOMEN PELVIS W CONTRAST  Result Date: 03/29/2020 CLINICAL DATA:  Left lower quadrant pain EXAM: CT ABDOMEN AND PELVIS WITH CONTRAST TECHNIQUE: Multidetector CT imaging of the abdomen and pelvis was performed using the standard protocol following bolus administration of intravenous contrast. CONTRAST:  19mL OMNIPAQUE IOHEXOL 300 MG/ML  SOLN COMPARISON:  02/16/2014 FINDINGS: Lower chest: No acute abnormality. Hepatobiliary: No focal liver abnormality is seen. No gallstones, gallbladder wall thickening, or biliary dilatation. Pancreas: Unremarkable. No pancreatic ductal dilatation or surrounding inflammatory changes. Spleen: Normal in size.  0.9 cm splenic cyst. Adrenals/Urinary Tract: Adrenal glands are unremarkable. Kidneys are normal, without renal calculi, focal lesion, or hydronephrosis. Bladder is unremarkable. Stomach/Bowel: Stomach is decompressed. Small bowel is nondilated. Normal appendix. The colon is nondilated with a few distal descending and proximal sigmoid diverticula. Vascular/Lymphatic: No significant vascular findings are present. No enlarged abdominal or pelvic lymph nodes. Reproductive: Tubal ligation clips. 6 cm left adnexal relatively  homogeneous low-attenuation process. Other: Small amount of pelvic and right pericolic gutter ascites. No free air. Musculoskeletal: No acute or significant osseous findings. IMPRESSION: 1. 6 cm left adnexal low-attenuation process, with small amount of pelvic and right pericolic gutter ascites. Primary considerations include tubo-ovarian abscess versus hemorrhagic cyst. Because this lesion is not adequately characterized, prompt [place] is recommended for further evaluation. 2. Descending and proximal sigmoid diverticulosis. Electronically Signed   By: Lucrezia Europe M.D.   On: 03/29/2020 09:43   US PELVIC COMPLETE W TRANSVAGINAL AND TORSION R/O  Result Date: 03/29/2020 CLINICAL DATA:  BILATERAL pelvic pain since Thursday, pelvic mass, abnormal CT; LMP 03/23/2020 EXAM: TRANSABDOMINAL AND TRANSVAGINAL ULTRASOUND OF PELVIS DOPPLER ULTRASOUND OF OVARIES TECHNIQUE: Both transabdominal and transvaginal ultrasound examinations of the pelvis were performed. Transabdominal technique was performed for global imaging of the pelvis including uterus, ovaries, adnexal regions, and pelvic cul-de-sac. It was necessary to proceed with endovaginal exam following the transabdominal exam to visualize the endometrium and ovaries. Color and duplex Doppler ultrasound was utilized to evaluate blood flow to the ovaries. COMPARISON:  CT abdomen and pelvis 03/29/2020 FINDINGS: Uterus Measurements: 12.3 x 5.4 x 9.1 LEFT fundal subserosal leiomyoma 4.9 x 4.9 x 5.9 cm and an anterior RIGHT subserosal leiomyoma 2.6 x 2.3 x 2.8 cm. The LEFT fundal leiomyoma 5.9 cm diameter corresponds to mass identified on CT. Endometrium Thickness: 17 mm.  No endometrial fluid or focal abnormality Right ovary Measurements: 3.5 x 1.9 x 1.5 cm = volume: 5.5 mL. Normal morphology without mass. Internal blood flow present on color Doppler imaging. Left ovary Measurements: 4.1 x 2.9 x 2.8 cm = volume: 17.3 mL. Normal morphology without mass. Internal blood flow present  on color Doppler imaging. Pulsed Doppler evaluation of both ovaries demonstrates normal low-resistance arterial and venous waveforms. Other findings Trace free pelvic fluid.  No adnexal masses. IMPRESSION: Two uterine leiomyomata, largest of which is 5.9 cm diameter at  the LEFT fundus and corresponds to the CT finding. Otherwise normal appearing uterus and ovaries. No sonographic evidence of ovarian mass or torsion. Electronically Signed   By: Lavonia Dana M.D.   On: 03/29/2020 11:42    Procedures Procedures (including critical care time)  Medications Ordered in ED Medications  sodium chloride 0.9 % bolus 1,000 mL (0 mLs Intravenous Stopped 03/29/20 0917)  fentaNYL (SUBLIMAZE) injection 50 mcg (50 mcg Intravenous Given 03/29/20 0810)  iohexol (OMNIPAQUE) 300 MG/ML solution 100 mL (100 mLs Intravenous Contrast Given 03/29/20 0919)  potassium chloride 10 mEq in 100 mL IVPB (0 mEq Intravenous Stopped 03/29/20 1146)  potassium chloride SA (KLOR-CON) CR tablet 40 mEq (40 mEq Oral Given 03/29/20 0935)  morphine 4 MG/ML injection 4 mg (4 mg Intravenous Given 03/29/20 0952)  metroNIDAZOLE (FLAGYL) tablet 2,000 mg (2,000 mg Oral Given 03/29/20 1302)  cefTRIAXone (ROCEPHIN) injection 500 mg (500 mg Intramuscular Given 03/29/20 1302)  doxycycline (VIBRA-TABS) tablet 100 mg (100 mg Oral Given 03/29/20 1302)  sterile water (preservative free) injection (2.1 mLs  Given 03/29/20 1313)    ED Course  I have reviewed the triage vital signs and the nursing notes.  Pertinent labs & imaging results that were available during my care of the patient were reviewed by me and considered in my medical decision making (see chart for details).    MDM Rules/Calculators/A&P                          Reshonda L Garrett is here with left-sided abdominal pain.  No significant medical history.  Normal vitals.  No fever.  Pain on and off the last several days.  May be constipation versus kidney stone versus  diverticulitis versus UTI.  Denies any vaginal discharge or bleeding.  No history of major abdominal surgeries.  Denies any trauma.  Will check basic screening labs and CT scan abdomen pelvis.  Will give fluid bolus and IV fentanyl for pain.  Lab work showed mild leukocytosis of 14.  Negative pregnancy test.  Hemoglobin of 8 but does have chronic heavy bleeding at times from her menstrual cycles.  Does not have any black stools or bloody stools.  MCV is 61 and suspect that this is a chronic anemia.  Lab work is otherwise unremarkable.  Potassium is 2.8 and patient given some potassium repletion.  CT scan showed left-sided cyst versus abscess versus mass in the pelvic area.  Otherwise CT scan was unremarkable.  An ultrasound and pelvic exam were performed at that time.  Patient still does not admit to any vaginal discharge or bleeding.  She had overall unremarkable pelvic exam.  She did have some scant discharge.  There is no cervical motion tenderness.  No concern for PID.  However she did test positive for trichomonas.  Ultrasound however showed 2 uterine leiomyomas.  There is no abscess or evidence of torsion.  Overall no concern for TOA.  Overall suspect pain is from fibroids.  Treated for trichomonas, gonorrhea, chlamydia.  Given information to follow-up at [facility] clinic.  Written for pain medicine and antibiotics discharged in ED in good condition.  We will have her follow-up with women's clinic.  This chart was dictated using voice recognition software.  Despite best efforts to proofread,  errors can occur which can change the documentation meaning.   Final Clinical Impression(s) / ED Diagnoses Final diagnoses:  Trichomonosis  Uterine leiomyoma, unspecified location    Rx / DC Orders ED Discharge  Orders         Ordered    oxyCODONE (ROXICODONE) 5 MG immediate release tablet  Every 6 hours PRN        03/29/20 1342    doxycycline (VIBRAMYCIN) 100 MG capsule  2 times daily        03/29/20 India Hook, [place], DO 03/29/20 1345

## 2020-03-29 NOTE — ED Notes (Signed)
Pt rounding complete. Pt resting comfortably with bed in lowest position and side rails up x2. Vitals updated. All needs met at this time. Will continue to monitor.  

## 2020-03-29 NOTE — ED Notes (Signed)
Pt transported to CT ?

## 2020-03-29 NOTE — ED Notes (Signed)
Pt. Made aware for the need of urine specimen. 

## 2020-03-30 LAB — GC/CHLAMYDIA PROBE AMP (~~LOC~~) NOT AT ARMC
Chlamydia: NEGATIVE
Comment: NEGATIVE
Comment: NORMAL
Neisseria Gonorrhea: NEGATIVE

## 2020-04-05 ENCOUNTER — Other Ambulatory Visit: Payer: Self-pay

## 2020-04-05 ENCOUNTER — Encounter: Payer: Self-pay | Admitting: Emergency Medicine

## 2020-04-05 ENCOUNTER — Ambulatory Visit
Admission: EM | Admit: 2020-04-05 | Discharge: 2020-04-05 | Disposition: A | Discharge status: Home / Self Care | Payer: Medicaid Other | Attending: Emergency Medicine | Admitting: Emergency Medicine

## 2020-04-05 DIAGNOSIS — R42 Dizziness and giddiness: Secondary | ICD-10-CM | POA: Diagnosis not present

## 2020-04-05 MED ORDER — MECLIZINE HCL 25 MG PO TABS: 25.0000 mg | ORAL_TABLET | Freq: Three times a day (TID) | ORAL | 0 refills | Status: DC | PRN

## 2020-04-05 NOTE — ED Triage Notes (Addendum)
Went to ED on Sunday, diagnosed with fibroid and treatment for trich.  Her pain improved, but now having dizziness.    Patient is taking doxycycline.   Patient also was taking oxycodone-finished this on Thursday.    Patient reports walking across parking lot, everything is moving side to side  Started new blood pressure medicine a week prior to [employer]

## 2020-04-05 NOTE — ED Provider Notes (Signed)
[facility]    CSN: [id number] Arrival date & time: 04/05/20  1147      History   Chief Complaint Chief Complaint  Patient presents with  . Dizziness    HPI Katie Garrett is a 40 y.o. female  Katie Garrett is a 40 y.o. female who presents for evaluation of dizziness. The symptoms started 1 week ago and are ongoing. The attacks occur daily and last a few minutes. Positions that worsen symptoms: bending over and going up & down stairs. Previous workup/treatments: none. Associated ear symptoms: none. Associated CNS symptoms: none. Recent infections: STI, on treatment w/ resolved symptoms. Head trauma: denied. Drug ingestion: none. Noise exposure: no occupational exposure. Family history: non-contributory.     Past Medical History:  Diagnosis Date  . Chlamydia   . Headache(784.0)   . Pregnancy induced hypertension   . Shortness of breath     Patient Active Problem List   Diagnosis Date Noted  . Cephalopelvic disproportion, delivered, current hospitalization 09/01/2012    Past Surgical History:  Procedure Laterality Date  . CESAREAN SECTION    . CESAREAN SECTION WITH BILATERAL TUBAL LIGATION Bilateral 09/01/2012   Procedure: CESAREAN SECTION WITH BILATERAL TUBAL LIGATION;  Surgeon: Tilda Burrow, MD;  Location: [facility];  Service: Obstetrics;  Laterality: Bilateral;  . TOOTH EXTRACTION      OB History    Gravida  2   Para  2   Term  2   Preterm      AB      Living  2     SAB      IAB      Ectopic      Multiple      Live Births  1            Home Medications    Prior to Admission medications   Medication Sig Start Date End Date Taking? Authorizing Provider  amLODipine (NORVASC) 10 MG tablet Take 1 tablet (10 mg total) by mouth daily. 03/20/20  Yes Wallis Bamberg, PA-C  doxycycline (VIBRAMYCIN) 100 MG capsule Take 1 capsule (100 mg total) by mouth 2 (two) times daily for 7 days. 03/29/20 04/05/20 Yes Curatolo,  Adam, DO  meclizine (ANTIVERT) 25 MG tablet Take 1 tablet (25 mg total) by mouth 3 (three) times daily as needed for dizziness. 04/05/20  Yes Hall-Potvin, [place], PA-C  Aspirin-Acetaminophen-Caffeine (GOODY HEADACHE PO) Take 1 packet by mouth every 8 (eight) hours as needed (headache/pain).    [provider]  hydrochlorothiazide (HYDRODIURIL) 12.5 MG tablet Take 1 tablet (12.5 mg total) by mouth daily. Patient not taking: No sig reported 09/03/12   Napoleon Form, MD  ibuprofen (ADVIL,MOTRIN) 800 MG tablet Take 1 tablet (800 mg total) by mouth every 8 (eight) hours as needed. Patient not taking: Reported on 03/29/2020 06/21/18   Charlestine Night, PA-C  oxyCODONE (ROXICODONE) 5 MG immediate release tablet Take 1 tablet (5 mg total) by mouth every 6 (six) hours as needed for up to 10 doses for severe pain. Patient not taking: Reported on 04/05/2020 03/29/20   Virgina Norfolk, DO    Family History Family History  Problem Relation Age of Onset  . Hypertension Mother   . Cancer Mother   . Hypertension Father     Social History Social History   Tobacco Use  . Smoking status: Former Smoker    Packs/day: 0.25    Types: Cigarettes    Quit date: 04/01/2012    Years since quitting:  8.0  . Smokeless tobacco: Never Used  Vaping Use  . Vaping Use: Never used  Substance Use Topics  . Alcohol use: No  . Drug use: No     Allergies   Onion   Review of Systems Review of Systems  Constitutional: Negative for fatigue and fever.  HENT: Negative for congestion, dental problem, ear pain, facial swelling, hearing loss, sinus pain, sore throat, trouble swallowing and voice change.   Eyes: Negative for photophobia, pain and visual disturbance.  Respiratory: Negative for cough and shortness of breath.   Cardiovascular: Negative for chest pain and palpitations.  Gastrointestinal: Negative for diarrhea and vomiting.  Musculoskeletal: Negative for arthralgias and myalgias.  Neurological:  Positive for dizziness. Negative for tremors, syncope, facial asymmetry, speech difficulty, weakness, light-headedness, numbness and headaches.     Physical Exam Triage Vital Signs ED Triage Vitals  Enc Vitals Group     BP 04/05/20 1229 130/81     Pulse Rate 04/05/20 1229 90     Resp 04/05/20 1229 18     Temp 04/05/20 1229 98.2 F (36.8 C)     Temp Source 04/05/20 1229 Oral     SpO2 04/05/20 1229 98 %     Weight --      Height --      Head Circumference --      Peak Flow --      Pain Score 04/05/20 1223 5     Pain Loc --      Pain Edu? --      Excl. in [place]? --    Orthostatic VS for the past 24 hrs:  BP- Lying Pulse- Lying BP- Sitting Pulse- Sitting BP- Standing at 0 minutes Pulse- Standing at 0 minutes  04/05/20 1322 115/76 87 121/79 91 126/84 92    Updated Vital Signs BP 130/81 (BP Location: Right Arm)   Pulse 90   Temp 98.2 F (36.8 C) (Oral)   Resp 18   LMP 03/08/2020   SpO2 98%   Visual Acuity Right Eye Distance:   Left Eye Distance:   Bilateral Distance:    Right Eye Near:   Left Eye Near:    Bilateral Near:     Physical Exam Constitutional:      General: She is not in acute distress. HENT:     Head: Normocephalic and atraumatic.     Right Ear: Tympanic membrane, ear canal and external ear normal.     Left Ear: Tympanic membrane, ear canal and external ear normal.     Mouth/Throat:     Mouth: Mucous membranes are moist.     Pharynx: Oropharynx is clear.  Eyes:     General: No scleral icterus.    Extraocular Movements: Extraocular movements intact.     Conjunctiva/sclera: Conjunctivae normal.     Pupils: Pupils are equal, round, and reactive to light.  Cardiovascular:     Rate and Rhythm: Normal rate.  Pulmonary:     Effort: Pulmonary effort is normal. No respiratory distress.     Breath sounds: No wheezing.  Musculoskeletal:        General: No deformity. Normal range of motion.     Cervical back: Normal range of motion. No rigidity or  tenderness.  Lymphadenopathy:     Cervical: No cervical adenopathy.  Skin:    Capillary Refill: Capillary refill takes less than 2 seconds.     Coloration: Skin is not jaundiced.     Findings: No bruising or rash.  Neurological:  Mental Status: She is alert.     Cranial Nerves: Cranial nerves are intact.     Sensory: Sensation is intact.     Motor: Motor function is intact.     Coordination: Coordination is intact.     Gait: Gait is intact.  Psychiatric:        Mood and Affect: Mood normal.        Behavior: Behavior normal.      UC Treatments / Results  Labs (all labs ordered are listed, but only abnormal results are displayed) Labs Reviewed  NOVEL CORONAVIRUS, NAA    EKG   Radiology No results found.  Procedures Procedures (including critical care time)  Medications Ordered in UC Medications - No data to display  Initial Impression / Assessment and Plan / UC Course  I have reviewed the triage vital signs and the nursing notes.  Pertinent labs & imaging results that were available during my care of the patient were reviewed by me and considered in my medical decision making (see chart for details).     Febrile, nontoxic in office today.  Orthostatic vitals unremarkable and she is without neurocognitive deficit.  Patient did recently change BP medications, though overall tolerating this well.  Will follow up with PCP regarding further evaluation and management thereof.  Low concern for BPPV.  Will trial meclizine, Covid pending.  Return precautions discussed, pt verbalized understanding and is agreeable to plan. Final Clinical Impressions(s) / UC Diagnoses   Final diagnoses:  Dizziness     Discharge Instructions     Talk to PCP about blood pressure medications: no orthostatic hypotension today (good thing)     ED Prescriptions    Medication Sig Dispense Auth. Provider   meclizine (ANTIVERT) 25 MG tablet Take 1 tablet (25 mg total) by mouth 3 (three)  times daily as needed for dizziness. 30 tablet Hall-Potvin, [place], PA-C     PDMP not reviewed this encounter.   Hall-Potvin, [place], [place] 04/05/20 1343

## 2020-04-05 NOTE — Discharge Instructions (Signed)
Talk to PCP about blood pressure medications: no orthostatic hypotension today (good thing)

## 2020-04-07 LAB — SARS-COV-2, NAA 2 DAY TAT

## 2020-04-07 LAB — NOVEL CORONAVIRUS, NAA: SARS-CoV-2, NAA: NOT DETECTED

## 2020-04-21 ENCOUNTER — Other Ambulatory Visit: Payer: Self-pay

## 2020-04-21 ENCOUNTER — Encounter (HOSPITAL_COMMUNITY): Payer: Self-pay | Admitting: Emergency Medicine

## 2020-04-21 DIAGNOSIS — Z5321 Procedure and treatment not carried out due to patient leaving prior to being seen by health care provider: Secondary | ICD-10-CM | POA: Diagnosis not present

## 2020-04-21 DIAGNOSIS — R1031 Right lower quadrant pain: Secondary | ICD-10-CM | POA: Insufficient documentation

## 2020-04-21 NOTE — ED Triage Notes (Signed)
Patient states she was diagnosed with a fibroid late December, thinks she has another one on her R side. Complains of RLQ abdominal pain. Also was dx with trich, states she took all of her abx for that. States thick white discharge on Friday. States she had another unprotected encounter with the partner that gave her trich, unsure if that partner was treated or not.

## 2020-04-22 ENCOUNTER — Emergency Department (HOSPITAL_COMMUNITY)
Admission: EM | Admit: 2020-04-22 | Discharge: 2020-04-22 | Disposition: A | Discharge status: Home / Self Care | Payer: Medicaid Other | Attending: Emergency Medicine | Admitting: Emergency Medicine

## 2020-04-22 NOTE — ED Notes (Signed)
Pt called for retake of vials. No answer.

## 2020-05-07 ENCOUNTER — Ambulatory Visit (INDEPENDENT_AMBULATORY_CARE_PROVIDER_SITE_OTHER): Payer: Medicaid Other | Admitting: Obstetrics & Gynecology

## 2020-05-07 ENCOUNTER — Other Ambulatory Visit: Payer: Self-pay

## 2020-05-07 ENCOUNTER — Other Ambulatory Visit (HOSPITAL_COMMUNITY)
Admission: RE | Admit: 2020-05-07 | Discharge: 2020-05-07 | Disposition: A | Discharge status: Home / Self Care | Payer: Medicaid Other | Source: Ambulatory Visit | Attending: Obstetrics & Gynecology | Admitting: Obstetrics & Gynecology

## 2020-05-07 ENCOUNTER — Encounter: Payer: Self-pay | Admitting: Obstetrics & Gynecology

## 2020-05-07 VITALS — BP 128/101 | HR 95 | Ht 61.0 in | Wt 154.8 lb

## 2020-05-07 DIAGNOSIS — D500 Iron deficiency anemia secondary to blood loss (chronic): Secondary | ICD-10-CM

## 2020-05-07 DIAGNOSIS — Z01419 Encounter for gynecological examination (general) (routine) without abnormal findings: Secondary | ICD-10-CM

## 2020-05-07 DIAGNOSIS — D259 Leiomyoma of uterus, unspecified: Secondary | ICD-10-CM | POA: Diagnosis not present

## 2020-05-07 DIAGNOSIS — N920 Excessive and frequent menstruation with regular cycle: Secondary | ICD-10-CM

## 2020-05-07 DIAGNOSIS — Z113 Encounter for screening for infections with a predominantly sexual mode of transmission: Secondary | ICD-10-CM

## 2020-05-07 LAB — CBC
Hematocrit: 28.5 % — ABNORMAL LOW (ref 34.0–46.6)
Hemoglobin: 7.8 g/dL — ABNORMAL LOW (ref 11.1–15.9)
MCH: 17.3 pg — ABNORMAL LOW (ref 26.6–33.0)
MCHC: 27.4 g/dL — ABNORMAL LOW (ref 31.5–35.7)
MCV: 63 fL — ABNORMAL LOW (ref 79–97)
Platelets: 574 10*3/uL — ABNORMAL HIGH (ref 150–450)
RBC: 4.51 x10E6/uL (ref 3.77–5.28)
RDW: 19.6 % — ABNORMAL HIGH (ref 11.7–15.4)
WBC: 6.5 10*3/uL (ref 3.4–10.8)

## 2020-05-07 MED ORDER — IBUPROFEN 600 MG PO TABS: 600.0000 mg | ORAL_TABLET | Freq: Four times a day (QID) | ORAL | 1 refills | Status: DC | PRN

## 2020-05-07 MED ORDER — FERROUS SULFATE 325 (65 FE) MG PO TABS: 325.0000 mg | ORAL_TABLET | ORAL | 3 refills | Status: DC

## 2020-05-07 NOTE — Progress Notes (Signed)
Pt has not taken BP meds today.  

## 2020-05-07 NOTE — Patient Instructions (Signed)
Uterine Fibroids  Uterine fibroids are lumps of tissue (tumors) in the womb (uterus). Fibroids are not cancerous. Most women with this condition do not need treatment. Sometimes, fibroids can make it harder to have children. If this happens, you may need surgery to take out the fibroids. What are the causes? The cause of this condition is not known. What increases the risk?  You are in your 30s or 40s and have not gone through menopause. Menopause is when you have not had a menstrual period for 12 months.  Having a history of fibroids in your family.  You are of African American descent.  You started your period at age 4 or younger.  You have not given birth.  You are overweight or very overweight. What are the signs or symptoms?  Bleeding between menstrual periods.  Heavy bleeding during your menstrual period.  Pain in the area between your hips.  Needing to pee (urinate) right away or more often than usual.  Not being able to have children (infertility).  Not being able to stay pregnant (miscarriage). Many women do not have symptoms.  How is this treated? Treatment may include:  Follow-up visits with your doctor to check your fibroids for any changes.  Medicines to help with pain, such as aspirin or ibuprofen.  Hormone therapy. This may be given as a pill, in a shot, or with a type of birth control device called an IUD.  Surgery that would do one of these things: ? Take out the fibroids. This may be done if you want to become pregnant. ? Take out the womb (hysterectomy). ? Stop the blood flow to the fibroids. Follow these instructions at home: Medicines  Take over-the-counter and prescription medicines only as told by your doctor.  Ask your doctor if you should: ? Take iron pills. ? Eat more foods that have a lot of iron in them, such as dark green, leafy vegetables. Managing pain If told, put heat on your back or belly. Do this as often as told by your  doctor. Use the heat source that your doctor recommends, such as a moist heat pack or a heating pad. To do this:  Put a towel between your skin and the heat pack or pad.  Leave the heat on for 20-30 minutes.  Take off the heat if your skin turns bright red. This is very important. If you cannot feel pain, heat, or cold, you may have a greater risk of getting burned.   General instructions  Tell your doctor about any changes to your menstrual period, such as: ? Heavy bleeding that needs a change of tampons or pads more than normal. ? A change in how many days your period lasts. ? A change in symptoms that come with your period. This might be belly cramps or back pain.  Keep all follow-up visits. Contact a doctor if:  You have pain that does not get better with medicine or heat. This may include pain or cramps in: ? The area between your hip bones. ? Your back. ? Your belly.  You have new bleeding between your periods.  You have more bleeding during or between your periods.  You feel very tired or weak.  You feel dizzy. Get help right away if:  You faint.  You have pain in the area between your hip bones that gets worse.  You have bleeding that soaks a tampon or pad in 30 minutes or less. Summary  Uterine fibroids are lumps of  tissue (tumors) in your womb. They are not cancerous.  Medicines such as aspirin or ibuprofen may be used to help with pain.  Contact a doctor if you have pain or cramps that do not get better with medicine.  Know the symptoms for when you should get help right away. This information is not intended to replace advice given to you by your health care provider. Make sure you discuss any questions you have with your health care provider. Document Revised: 10/22/2019 Document Reviewed: 10/22/2019 Elsevier Patient Education  2021 Elsevier Inc.  

## 2020-05-07 NOTE — Progress Notes (Signed)
Patient ID: Katie Garrett, female   DOB: Sep 19, 1980, 40 y.o.   MRN: [id number]  Chief Complaint  Patient presents with  . Gynecologic Exam    HPI Katie Garrett is a 40 y.o. female.  [id number] Patient's last menstrual period was 04/27/2019 (exact date). Regular menses with heavy flow and clots for 7-8 days. [place] was done at ED visit for pain and dx uterine fibroids, CBC showed anemia HPI  Past Medical History:  Diagnosis Date  . Chlamydia   . Headache(784.0)   . Pregnancy induced hypertension   . Shortness of breath     Past Surgical History:  Procedure Laterality Date  . CESAREAN SECTION    . CESAREAN SECTION WITH BILATERAL TUBAL LIGATION Bilateral 09/01/2012   Procedure: CESAREAN SECTION WITH BILATERAL TUBAL LIGATION;  Surgeon: Jonnie Kind, MD;  Location: [facility];  Service: Obstetrics;  Laterality: Bilateral;  . TOOTH EXTRACTION    . TUBAL LIGATION      Family History  Problem Relation Age of Onset  . Hypertension Mother   . Cancer Mother   . Hypertension Father     Social History Social History   Tobacco Use  . Smoking status: Former Smoker    Packs/day: 0.25    Types: Cigarettes    Quit date: 04/01/2012    Years since quitting: 8.1  . Smokeless tobacco: Never Used  Vaping Use  . Vaping Use: Never used  Substance Use Topics  . Alcohol use: No  . Drug use: No    Allergies  Allergen Reactions  . Onion Anaphylaxis and Swelling    Current Outpatient Medications  Medication Sig Dispense Refill  . amLODipine (NORVASC) 10 MG tablet Take 1 tablet (10 mg total) by mouth daily. 90 tablet 0  . diphenhydramine-acetaminophen (TYLENOL PM) 25-500 MG TABS tablet Take 1 tablet by mouth at bedtime as needed.    . ferrous sulfate 325 (65 FE) MG tablet Take 1 tablet (325 mg total) by mouth every other day. 30 tablet 3  . ibuprofen (ADVIL) 600 MG tablet Take 1 tablet (600 mg total) by mouth every 6 (six) hours as needed. 30 tablet 1  . meclizine  (ANTIVERT) 25 MG tablet Take 1 tablet (25 mg total) by mouth 3 (three) times daily as needed for dizziness. 30 tablet 0  . Aspirin-Acetaminophen-Caffeine (GOODY HEADACHE PO) Take 1 packet by mouth every 8 (eight) hours as needed (headache/pain). (Patient not taking: Reported on 05/07/2020)    . hydrochlorothiazide (HYDRODIURIL) 12.5 MG tablet Take 1 tablet (12.5 mg total) by mouth daily. (Patient not taking: No sig reported) 30 tablet 1  . oxyCODONE (ROXICODONE) 5 MG immediate release tablet Take 1 tablet (5 mg total) by mouth every 6 (six) hours as needed for up to 10 doses for severe pain. (Patient not taking: Reported on 04/05/2020) 10 tablet 0   No current facility-administered medications for this visit.    Review of Systems Review of Systems  Constitutional: Negative.   HENT: Negative.   Respiratory: Negative.   Genitourinary: Positive for menstrual problem. Negative for pelvic pain, vaginal bleeding and vaginal discharge.  Neurological: Positive for headaches.    Blood pressure (!) 128/101, pulse 95, height 5\' 1"  (1.549 m), weight 154 lb 12.8 oz (70.2 kg), last menstrual period 04/27/2019.  Physical Exam Physical Exam Constitutional:      Appearance: Normal appearance.  Pulmonary:     Effort: Pulmonary effort is normal.  Genitourinary:    General: Normal vulva.  Cervix: Normal.     Uterus: Enlarged (8 week size).      Adnexa: Right adnexa normal and left adnexa normal.  Skin:    General: Skin is warm and dry.  Neurological:     Mental Status: She is alert.  Psychiatric:        Mood and Affect: Mood normal.        Behavior: Behavior normal.     Data Reviewed Narrative & Impression  CLINICAL DATA:  BILATERAL pelvic pain since Thursday, pelvic mass, abnormal CT; LMP 03/23/2020  EXAM: TRANSABDOMINAL AND TRANSVAGINAL ULTRASOUND OF PELVIS  DOPPLER ULTRASOUND OF OVARIES  TECHNIQUE: Both transabdominal and transvaginal ultrasound examinations of the pelvis were  performed. Transabdominal technique was performed for global imaging of the pelvis including uterus, ovaries, adnexal regions, and pelvic cul-de-sac.  It was necessary to proceed with endovaginal exam following the transabdominal exam to visualize the endometrium and ovaries. Color and duplex Doppler ultrasound was utilized to evaluate blood flow to the ovaries.  COMPARISON:  CT abdomen and pelvis 03/29/2020  FINDINGS: Uterus  Measurements: 12.3 x 5.4 x 9.1 LEFT fundal subserosal leiomyoma 4.9 x 4.9 x 5.9 cm and an anterior RIGHT subserosal leiomyoma 2.6 x 2.3 x 2.8 cm. The LEFT fundal leiomyoma 5.9 cm diameter corresponds to mass identified on CT.  Endometrium  Thickness: 17 mm.  No endometrial fluid or focal abnormality  Right ovary  Measurements: 3.5 x 1.9 x 1.5 cm = volume: 5.5 mL. Normal morphology without mass. Internal blood flow present on color Doppler imaging.  Left ovary  Measurements: 4.1 x 2.9 x 2.8 cm = volume: 17.3 mL. Normal morphology without mass. Internal blood flow present on color Doppler imaging.  Pulsed Doppler evaluation of both ovaries demonstrates normal low-resistance arterial and venous waveforms.  Other findings  Trace free pelvic fluid.  No adnexal masses.  IMPRESSION: Two uterine leiomyomata, largest of which is 5.9 cm diameter at the LEFT fundus and corresponds to the CT finding.  Otherwise normal appearing uterus and ovaries.  No sonographic evidence of ovarian mass or torsion.   Electronically Signed   By: Lavonia Dana M.D.   On: 03/29/2020 11:42     Assessment Well female exam with routine gynecological exam - Plan: Cytology - PAP( [place])  Screening for STD (sexually transmitted disease) - Plan: Cytology - PAP( [place])  Uterine leiomyoma, unspecified location - Plan: ibuprofen (ADVIL) 600 MG tablet  Menorrhagia with regular cycle - Plan: CBC, ferrous sulfate 325 (65 FE) MG tablet  Anemia  due to blood loss, chronic    Plan F/u pap result NSAID during menses Rx RTC 3 months CBC     Emeterio Reeve 05/07/2020, 10:34 AM

## 2020-05-11 LAB — CYTOLOGY - PAP
Adequacy: ABSENT
Chlamydia: NEGATIVE
Comment: NEGATIVE
Comment: NEGATIVE
Comment: NEGATIVE
Comment: NORMAL
Diagnosis: NEGATIVE
High risk HPV: NEGATIVE
Neisseria Gonorrhea: NEGATIVE
Trichomonas: POSITIVE — AB

## 2020-05-14 ENCOUNTER — Telehealth: Payer: Self-pay

## 2020-05-14 ENCOUNTER — Other Ambulatory Visit: Payer: Self-pay | Admitting: Obstetrics & Gynecology

## 2020-05-14 DIAGNOSIS — Z113 Encounter for screening for infections with a predominantly sexual mode of transmission: Secondary | ICD-10-CM

## 2020-05-14 MED ORDER — METRONIDAZOLE 500 MG PO TABS: ORAL_TABLET | ORAL | 0 refills | Status: DC

## 2020-05-14 NOTE — Telephone Encounter (Signed)
-----   Message from Woodroe Mode, MD sent at 05/14/2020  2:02 PM EST ----- Positive for trichomonas, will send Flagyl prescription

## 2020-05-14 NOTE — Progress Notes (Signed)
Meds ordered this encounter  Medications  . metroNIDAZOLE (FLAGYL) 500 MG tablet    Sig: Take two tablets by mouth twice a day, for one day.  Or you can take all four tablets at once if you can tolerate it.    Dispense:  4 tablet    Refill:  0

## 2020-05-14 NOTE — Telephone Encounter (Signed)
Left message on VM of pt's identity with name. Pt advised to return call for any questions or concerns with results or Rx.  Encounter closed

## 2021-04-26 ENCOUNTER — Emergency Department (HOSPITAL_COMMUNITY)
Admission: EM | Admit: 2021-04-26 | Discharge: 2021-04-27 | Disposition: A | Discharge status: Home / Self Care | Payer: Medicaid Other | Attending: Emergency Medicine | Admitting: Emergency Medicine

## 2021-04-26 ENCOUNTER — Other Ambulatory Visit: Payer: Self-pay

## 2021-04-26 DIAGNOSIS — Z79899 Other long term (current) drug therapy: Secondary | ICD-10-CM | POA: Insufficient documentation

## 2021-04-26 DIAGNOSIS — K0889 Other specified disorders of teeth and supporting structures: Secondary | ICD-10-CM | POA: Diagnosis not present

## 2021-04-26 DIAGNOSIS — Z7982 Long term (current) use of aspirin: Secondary | ICD-10-CM | POA: Diagnosis not present

## 2021-04-26 NOTE — ED Triage Notes (Signed)
Patient c/o dental pain x2 days. Pt report taking tylenol without relief. Pt denies fever. Pt a/xo4.

## 2021-04-27 ENCOUNTER — Encounter (HOSPITAL_COMMUNITY): Payer: Self-pay | Admitting: Emergency Medicine

## 2021-04-27 MED ORDER — OXYCODONE-ACETAMINOPHEN 5-325 MG PO TABS
1.0000 | ORAL_TABLET | Freq: Once | ORAL | Status: AC
  Administered 2021-04-27: 02:00:00 1 via ORAL
  Filled 2021-04-27: qty 1

## 2021-04-27 MED ORDER — PENICILLIN V POTASSIUM 500 MG PO TABS: 500.0000 mg | ORAL_TABLET | Freq: Four times a day (QID) | ORAL | 0 refills | Status: AC

## 2021-04-27 NOTE — ED Provider Notes (Signed)
[name] Provider Note   CSN: [id number] Arrival date & time: 04/26/21  2126     History  Chief Complaint  Patient presents with   Dental Pain    Katie Garrett is a 41 y.o. female.  HPI  Patient without significant medical history presents with chief complaint of right lower dental pain.  Patient's pain has gone on for about 2 days time, started after she was eating something fell to her right abdominal molar get chipped and started to have pain, she states now she has a constant pain,  throbbing-like sensation, will go up into her ear and give her a slight headache,  she denies any tongue, throat, lip swelling difficulty breathing no trismus or torticollis, no fevers or chills no chest pain shortness of breath no peripheral edema.  She has not been on any antibiotics, unable to see dentist, tried  over-the-counter medicine without much relief  home Medications Prior to Admission medications   Medication Sig Start Date End Date Taking? Authorizing Provider  penicillin v potassium (VEETID) 500 MG tablet Take 1 tablet (500 mg total) by mouth 4 (four) times daily for 7 days. 04/27/21 05/04/21 Yes Marcello Fennel, PA-C  amLODipine (NORVASC) 10 MG tablet Take 1 tablet (10 mg total) by mouth daily. 03/20/20   Jaynee Eagles, PA-C  Aspirin-Acetaminophen-Caffeine (GOODY HEADACHE PO) Take 1 packet by mouth every 8 (eight) hours as needed (headache/pain). Patient not taking: Reported on 05/07/2020    [provider]  diphenhydramine-acetaminophen (TYLENOL PM) 25-500 MG TABS tablet Take 1 tablet by mouth at bedtime as needed.    [provider]  ferrous sulfate 325 (65 FE) MG tablet Take 1 tablet (325 mg total) by mouth every other day. 05/07/20   Woodroe Mode, MD  hydrochlorothiazide (HYDRODIURIL) 12.5 MG tablet Take 1 tablet (12.5 mg total) by mouth daily. Patient not taking: No sig reported 09/03/12   Martha Clan, MD  ibuprofen  (ADVIL) 600 MG tablet Take 1 tablet (600 mg total) by mouth every 6 (six) hours as needed. 05/07/20   Woodroe Mode, MD  meclizine (ANTIVERT) 25 MG tablet Take 1 tablet (25 mg total) by mouth 3 (three) times daily as needed for dizziness. 04/05/20   Hall-Potvin, [place], PA-C  metroNIDAZOLE (FLAGYL) 500 MG tablet Take two tablets by mouth twice a day, for one day.  Or you can take all four tablets at once if you can tolerate it. 05/14/20   Woodroe Mode, MD  oxyCODONE (ROXICODONE) 5 MG immediate release tablet Take 1 tablet (5 mg total) by mouth every 6 (six) hours as needed for up to 10 doses for severe pain. Patient not taking: Reported on 04/05/2020 03/29/20   Lennice Sites, DO      Allergies    Onion    Review of Systems   Review of Systems  Constitutional:  Negative for chills and fever.  HENT:  Positive for dental problem. Negative for sore throat.   Respiratory:  Negative for shortness of breath.   Cardiovascular:  Negative for chest pain.  Gastrointestinal:  Negative for abdominal pain.  Neurological:  Negative for headaches.   Physical Exam Updated Vital Signs BP (!) 143/96 (BP Location: Right Arm)    Pulse 83    Temp 98.2 F (36.8 C) (Oral)    Resp 16    Ht 5\' 1"  (1.549 m)    Wt 69.9 kg    SpO2 100%    BMI 29.10 kg/m  Physical Exam Vitals and nursing note reviewed.  Constitutional:      General: She is not in acute distress.    Appearance: She is not ill-appearing.  HENT:     Head: Normocephalic and atraumatic.     Nose: No congestion.     Mouth/Throat:     Mouth: Mucous membranes are moist.     Pharynx: Oropharynx is clear. No oropharyngeal exudate or posterior oropharyngeal erythema.     Comments: No trismus or torticollis oropharynx is visualized tongue uvula midline controlling oral secretions, no tongue elevation, she has poor dental hygiene noted cavity in the right lower molar, no gingivitis no palpable fluctuance induration noted. Eyes:     Extraocular Movements:  Extraocular movements intact.     Conjunctiva/sclera: Conjunctivae normal.     Pupils: Pupils are equal, round, and reactive to light.  Cardiovascular:     Rate and Rhythm: Normal rate and regular rhythm.     Pulses: Normal pulses.     Heart sounds: No murmur heard.   No friction rub. No gallop.  Pulmonary:     Effort: No respiratory distress.     Breath sounds: No wheezing, rhonchi or rales.  Musculoskeletal:     Right lower leg: No edema.     Left lower leg: No edema.  Skin:    General: Skin is warm and dry.  Neurological:     Mental Status: She is alert.  Psychiatric:        Mood and Affect: Mood normal.    ED Results / Procedures / Treatments   Labs (all labs ordered are listed, but only abnormal results are displayed) Labs Reviewed - No data to display  EKG None  Radiology No results found.  Procedures Procedures    Medications Ordered in ED Medications  oxyCODONE-acetaminophen (PERCOCET/ROXICET) 5-325 MG per tablet 1 tablet (has no administration in time range)    ED Course/ Medical Decision Making/ A&P                           Medical Decision Making  This patient presents to the ED for concern of dental pain, this involves an extensive number of treatment options, and is a complaint that carries with it a high risk of complications and morbidity.  The differential diagnosis includes drainable abscess, Ludwig angina retropharyngeal abscess    Additional history obtained:  Additional history obtained from electronic medical record  Co morbidities that complicate the patient evaluation  N/A  Social Determinants of Health:  N/A    Lab Tests:  I Ordered, and personally interpreted labs.  The pertinent results include: N/A   Imaging Studies ordered:  I ordered imaging studies including N/A    Rule out I have low suspicion for peritonsillar abscess, retropharyngeal abscess, or Ludwig angina as oropharynx was visualized tongue and uvula  were both midline, there is no exudates, erythema or edema noted in the posterior pillars or on/ around tonsils.  Low suspicion for an abscess as gumline were palpated no fluctuance or induration felt.  Low suspicion for periorbital or orbital cellulitis as patient face had no erythematous, patient EOMs were intact, he had no pain with eye movement.     Dispostion and problem list  After consideration of the diagnostic results and the patients response to treatment, I feel that the patent would benefit from   Dental pain-likely patient having from a cavity cannot fully exclude dental abscess will start her on  antibiotics follow-up with dentist for further evaluation gave her strict return precautions..             Final Clinical Impression(s) / ED Diagnoses Final diagnoses:  Pain, dental    Rx / DC Orders ED Discharge Orders          Ordered    penicillin v potassium (VEETID) 500 MG tablet  4 times daily        04/27/21 0154              Marcello Fennel, PA-C 04/27/21 0155    Ripley Fraise, MD 04/27/21 2328

## 2021-04-27 NOTE — Discharge Instructions (Signed)
Likely you have a dental cavity, starting on antibiotics please take as prescribed.  Recommend Tylenol every 6 hours for pain control.  Information above regarding densities within the area please call  Come back to the emergency department if you develop chest pain, shortness of breath, severe abdominal pain, uncontrolled nausea, vomiting, diarrhea.

## 2021-10-14 ENCOUNTER — Ambulatory Visit
Admission: EM | Admit: 2021-10-14 | Discharge: 2021-10-14 | Disposition: A | Discharge status: Home / Self Care | Payer: Medicaid Other | Attending: Family Medicine | Admitting: Family Medicine

## 2021-10-14 DIAGNOSIS — K0889 Other specified disorders of teeth and supporting structures: Secondary | ICD-10-CM | POA: Diagnosis not present

## 2021-10-14 MED ORDER — KETOROLAC TROMETHAMINE 30 MG/ML IJ SOLN
30.0000 mg | Freq: Once | INTRAMUSCULAR | Status: AC
  Administered 2021-10-14: 30 mg via INTRAMUSCULAR

## 2021-10-14 MED ORDER — AMOXICILLIN 875 MG PO TABS: 875.0000 mg | ORAL_TABLET | Freq: Two times a day (BID) | ORAL | 0 refills | Status: AC

## 2021-10-14 MED ORDER — NAPROXEN 500 MG PO TABS: 500.0000 mg | ORAL_TABLET | Freq: Two times a day (BID) | ORAL | 0 refills | Status: DC | PRN

## 2021-10-14 NOTE — ED Triage Notes (Signed)
Pt presents with right side dental pain and swelling X 4 days.

## 2021-10-14 NOTE — Discharge Instructions (Addendum)
Take amoxicillin 875 mg--1 tab twice daily for 7 days  Take naproxen 500 mg--1 tablet every 12 hours as needed for pain  You have been given a shot of Toradol 30 mg today.

## 2021-10-14 NOTE — ED Provider Notes (Signed)
[facility]    CSN: [id number] Arrival date & time: 10/14/21  4098      History   Chief Complaint Chief Complaint  Patient presents with   Dental Problem    HPI Katie Garrett is a 41 y.o. female.   HPI Here for right-sided jaw pain that began about July 10.  It is worsened, and she notes a broken tooth on that side.  She does have a dentist.  She has no allergy medicine.  She states if she takes ibuprofen it makes her blood pressure go up.  She did throw up once yesterday but she states she is not having any nausea or vomiting today  Past Medical History:  Diagnosis Date   Chlamydia    Headache(784.0)    Pregnancy induced hypertension    Shortness of breath     Patient Active Problem List   Diagnosis Date Noted   Cephalopelvic disproportion, delivered, current hospitalization 09/01/2012    Past Surgical History:  Procedure Laterality Date   CESAREAN SECTION     CESAREAN SECTION WITH BILATERAL TUBAL LIGATION Bilateral 09/01/2012   Procedure: CESAREAN SECTION WITH BILATERAL TUBAL LIGATION;  Surgeon: Jonnie Kind, MD;  Location: [facility];  Service: Obstetrics;  Laterality: Bilateral;   TOOTH EXTRACTION     TUBAL LIGATION      OB History     Gravida  2   Para  2   Term  2   Preterm      AB      Living  2      SAB      IAB      Ectopic      Multiple      Live Births  2            Home Medications    Prior to Admission medications   Medication Sig Start Date End Date Taking? Authorizing Provider  amoxicillin (AMOXIL) 875 MG tablet Take 1 tablet (875 mg total) by mouth 2 (two) times daily for 7 days. 10/14/21 10/21/21 Yes Barrett Henle, MD  naproxen (NAPROSYN) 500 MG tablet Take 1 tablet (500 mg total) by mouth 2 (two) times daily as needed (pain). 10/14/21  Yes Barrett Henle, MD  amLODipine (NORVASC) 10 MG tablet Take 1 tablet (10 mg total) by mouth daily. 03/20/20   Jaynee Eagles, PA-C   Aspirin-Acetaminophen-Caffeine (GOODY HEADACHE PO) Take 1 packet by mouth every 8 (eight) hours as needed (headache/pain). Patient not taking: Reported on 05/07/2020    [provider]  diphenhydramine-acetaminophen (TYLENOL PM) 25-500 MG TABS tablet Take 1 tablet by mouth at bedtime as needed.    [provider]  ferrous sulfate 325 (65 FE) MG tablet Take 1 tablet (325 mg total) by mouth every other day. 05/07/20   Woodroe Mode, MD  hydrochlorothiazide (HYDRODIURIL) 12.5 MG tablet Take 1 tablet (12.5 mg total) by mouth daily. Patient not taking: No sig reported 09/03/12   Martha Clan, MD  meclizine (ANTIVERT) 25 MG tablet Take 1 tablet (25 mg total) by mouth 3 (three) times daily as needed for dizziness. 04/05/20   Hall-Potvin, [place], PA-C    Family History Family History  Problem Relation Age of Onset   Hypertension Mother    Cancer Mother    Hypertension Father     Social History Social History   Tobacco Use   Smoking status: Former    Packs/day: 0.25    Types: Cigarettes    Quit  date: 04/01/2012    Years since quitting: 9.5   Smokeless tobacco: Never  Vaping Use   Vaping Use: Never used  Substance Use Topics   Alcohol use: No   Drug use: No     Allergies   Onion   Review of Systems Review of Systems   Physical Exam Triage Vital Signs ED Triage Vitals [10/14/21 1017]  Enc Vitals Group     BP 130/90     Pulse Rate 89     Resp 17     Temp 98.6 F (37 C)     Temp Source Oral     SpO2 100 %     Weight      Height      Head Circumference      Peak Flow      Pain Score 10     Pain Loc      Pain Edu?      Excl. in [place]?    No data found.  Updated Vital Signs BP 130/90 (BP Location: Left Arm)   Pulse 89   Temp 98.6 F (37 C) (Oral)   Resp 17   LMP 10/06/2021   SpO2 100%   Visual Acuity Right Eye Distance:   Left Eye Distance:   Bilateral Distance:    Right Eye Near:   Left Eye Near:    Bilateral Near:     Physical  Exam Vitals reviewed.  Constitutional:      General: She is not in acute distress.    Appearance: She is not ill-appearing, toxic-appearing or diaphoretic.  HENT:     Head:     Comments: There is tenderness along her right jawline.  No mass there    Nose: Nose normal.     Mouth/Throat:     Mouth: Mucous membranes are moist.     Pharynx: No oropharyngeal exudate or posterior oropharyngeal erythema.     Comments: There is a broken tooth on her right lower dental ridge in the back.  She also has some dental caries on the left lower cheek Cardiovascular:     Rate and Rhythm: Normal rate and regular rhythm.     Heart sounds: No murmur heard. Pulmonary:     Effort: Pulmonary effort is normal.     Breath sounds: No stridor. No wheezing, rhonchi or rales.  Musculoskeletal:     Cervical back: Neck supple.  Lymphadenopathy:     Cervical: No cervical adenopathy.  Neurological:     General: No focal deficit present.     Mental Status: She is alert and oriented to person, place, and time.  Psychiatric:        Behavior: Behavior normal.      UC Treatments / Results  Labs (all labs ordered are listed, but only abnormal results are displayed) Labs Reviewed - No data to display  EKG   Radiology No results found.  Procedures Procedures (including critical care time)  Medications Ordered in UC Medications  ketorolac (TORADOL) 30 MG/ML injection 30 mg (has no administration in time range)    Initial Impression / Assessment and Plan / UC Course  I have reviewed the triage vital signs and the nursing notes.  Pertinent labs & imaging results that were available during my care of the patient were reviewed by me and considered in my medical decision making (see chart for details).     We will treat with amoxicillin and naproxen.  She will call her dentist to get an appointment  for soon. Final Clinical Impressions(s) / UC Diagnoses   Final diagnoses:  None     Discharge  Instructions      Take amoxicillin 875 mg--1 tab twice daily for 7 days  Take naproxen 500 mg--1 tablet every 12 hours as needed for pain  You have been given a shot of Toradol 30 mg today.      ED Prescriptions     Medication Sig Dispense Auth. Provider   amoxicillin (AMOXIL) 875 MG tablet Take 1 tablet (875 mg total) by mouth 2 (two) times daily for 7 days. 14 tablet Buddy Loeffelholz, Gwenlyn Perking, MD   naproxen (NAPROSYN) 500 MG tablet Take 1 tablet (500 mg total) by mouth 2 (two) times daily as needed (pain). 30 tablet Iyona Pehrson, Gwenlyn Perking, MD      PDMP not reviewed this encounter.   Barrett Henle, MD 10/14/21 1038

## 2021-12-28 ENCOUNTER — Other Ambulatory Visit: Payer: Self-pay

## 2021-12-28 ENCOUNTER — Emergency Department (HOSPITAL_COMMUNITY)
Admission: EM | Admit: 2021-12-28 | Discharge: 2021-12-28 | Discharge status: Against Medical Advice | Payer: Medicaid Other

## 2021-12-28 NOTE — ED Notes (Signed)
This patient did not answer for vitals

## 2021-12-28 NOTE — ED Notes (Signed)
This patient has not answered for triage x3

## 2022-03-11 NOTE — [facility]regational Nurse Program (Signed)
Brief encounter to maintain contact.  After review, will followup with dental care and establishing a pcp. Vinnie Langton, RN, [place] Nurse, [phone]

## 2022-07-12 ENCOUNTER — Ambulatory Visit
Admission: EM | Admit: 2022-07-12 | Discharge: 2022-07-12 | Discharge status: Against Medical Advice | Payer: Medicaid Other | Attending: Family Medicine | Admitting: Family Medicine

## 2022-07-12 NOTE — ED Notes (Signed)
Pt did not answer.

## 2022-07-13 ENCOUNTER — Ambulatory Visit
Admission: EM | Admit: 2022-07-13 | Discharge: 2022-07-13 | Discharge status: Against Medical Advice | Payer: Medicaid Other

## 2022-07-16 ENCOUNTER — Emergency Department (HOSPITAL_COMMUNITY): Payer: Medicaid Other

## 2022-07-16 ENCOUNTER — Emergency Department (HOSPITAL_COMMUNITY)
Admission: EM | Admit: 2022-07-16 | Discharge: 2022-07-17 | Disposition: A | Discharge status: Home / Self Care | Payer: Medicaid Other | Attending: Emergency Medicine | Admitting: Emergency Medicine

## 2022-07-16 ENCOUNTER — Other Ambulatory Visit: Payer: Self-pay

## 2022-07-16 DIAGNOSIS — N920 Excessive and frequent menstruation with regular cycle: Secondary | ICD-10-CM

## 2022-07-16 DIAGNOSIS — Z7982 Long term (current) use of aspirin: Secondary | ICD-10-CM | POA: Diagnosis not present

## 2022-07-16 DIAGNOSIS — R519 Headache, unspecified: Secondary | ICD-10-CM

## 2022-07-16 DIAGNOSIS — E876 Hypokalemia: Secondary | ICD-10-CM

## 2022-07-16 DIAGNOSIS — D649 Anemia, unspecified: Secondary | ICD-10-CM | POA: Diagnosis present

## 2022-07-16 LAB — IRON AND TIBC
Iron: <5 ug/dL — ABNORMAL LOW (ref 28–170)
TIBC: 466 ug/dL — ABNORMAL HIGH (ref 250–450)

## 2022-07-16 LAB — CBC WITH DIFFERENTIAL/PLATELET
Abs Immature Granulocytes: 0 10*3/uL (ref 0.00–0.07)
Basophils Absolute: 0 10*3/uL (ref 0.0–0.1)
Basophils Relative: 0 %
Eosinophils Absolute: 0.2 10*3/uL (ref 0.0–0.5)
Eosinophils Relative: 3 %
HCT: 22.6 % — ABNORMAL LOW (ref 36.0–46.0)
Hemoglobin: 5.9 g/dL — CL (ref 12.0–15.0)
Lymphocytes Relative: 13 %
Lymphs Abs: 0.9 10*3/uL (ref 0.7–4.0)
MCH: 15.5 pg — ABNORMAL LOW (ref 26.0–34.0)
MCHC: 26.1 g/dL — ABNORMAL LOW (ref 30.0–36.0)
MCV: 59.5 fL — ABNORMAL LOW (ref 80.0–100.0)
Monocytes Absolute: 0.1 10*3/uL (ref 0.1–1.0)
Monocytes Relative: 1 %
Neutro Abs: 6 10*3/uL (ref 1.7–7.7)
Neutrophils Relative %: 83 %
Platelets: 271 10*3/uL (ref 150–400)
RBC: 3.8 MIL/uL — ABNORMAL LOW (ref 3.87–5.11)
RDW: 23.4 % — ABNORMAL HIGH (ref 11.5–15.5)
WBC: 7.2 10*3/uL (ref 4.0–10.5)
nRBC: 0 % (ref 0.0–0.2)
nRBC: 0 /100 WBC

## 2022-07-16 LAB — HEMOGLOBIN AND HEMATOCRIT, BLOOD
HCT: 27.5 % — ABNORMAL LOW (ref 36.0–46.0)
Hemoglobin: 8.3 g/dL — ABNORMAL LOW (ref 12.0–15.0)

## 2022-07-16 LAB — FOLATE: Folate: 7.2 ng/mL (ref >5.9)

## 2022-07-16 LAB — MAGNESIUM: Magnesium: 2.1 mg/dL (ref 1.7–2.4)

## 2022-07-16 LAB — BASIC METABOLIC PANEL
Anion gap: 10 (ref 5–15)
BUN: 14 mg/dL (ref 6–20)
CO2: 24 mmol/L (ref 22–32)
Calcium: 8.9 mg/dL (ref 8.9–10.3)
Chloride: 103 mmol/L (ref 98–111)
Creatinine, Ser: 0.76 mg/dL (ref 0.44–1.00)
GFR, Estimated: >60 mL/min (ref >60)
Glucose, Bld: 103 mg/dL — ABNORMAL HIGH (ref 70–99)
Potassium: 2.9 mmol/L — ABNORMAL LOW (ref 3.5–5.1)
Sodium: 137 mmol/L (ref 135–145)

## 2022-07-16 LAB — I-STAT BETA HCG BLOOD, ED (MC, WL, AP ONLY): I-stat hCG, quantitative: <5 m[IU]/mL (ref <5)

## 2022-07-16 LAB — RETICULOCYTES
Immature Retic Fract: 11.3 % (ref 2.3–15.9)
RBC.: 3.42 MIL/uL — ABNORMAL LOW (ref 3.87–5.11)
Retic Count, Absolute: 24.6 10*3/uL (ref 19.0–186.0)
Retic Ct Pct: 0.7 % (ref 0.4–3.1)

## 2022-07-16 LAB — ABO/RH: ABO/RH(D): O POS

## 2022-07-16 LAB — FERRITIN: Ferritin: 1 ng/mL — ABNORMAL LOW (ref 11–307)

## 2022-07-16 LAB — PREPARE RBC (CROSSMATCH)

## 2022-07-16 LAB — VITAMIN B12: Vitamin B-12: 484 pg/mL (ref 180–914)

## 2022-07-16 MED ORDER — SODIUM CHLORIDE 0.9% IV SOLUTION
Freq: Once | INTRAVENOUS | Status: AC
Start: 2022-07-16 — End: 2022-07-16

## 2022-07-16 MED ORDER — KETOROLAC TROMETHAMINE 30 MG/ML IJ SOLN
30.0000 mg | Freq: Once | INTRAMUSCULAR | Status: AC
  Administered 2022-07-16: 30 mg via INTRAVENOUS

## 2022-07-16 MED ORDER — IOHEXOL 350 MG/ML SOLN
75.0000 mL | Freq: Once | INTRAVENOUS | Status: AC | PRN
  Administered 2022-07-16: 75 mL via INTRAVENOUS

## 2022-07-16 MED ORDER — POTASSIUM CHLORIDE CRYS ER 20 MEQ PO TBCR: 20.0000 meq | EXTENDED_RELEASE_TABLET | Freq: Two times a day (BID) | ORAL | 0 refills | Status: DC

## 2022-07-16 MED ORDER — POTASSIUM CHLORIDE 10 MEQ/100ML IV SOLN
10.0000 meq | INTRAVENOUS | Status: AC
  Administered 2022-07-16: 10 meq via INTRAVENOUS
  Filled 2022-07-16: qty 100

## 2022-07-16 MED ORDER — POTASSIUM CHLORIDE CRYS ER 20 MEQ PO TBCR
40.0000 meq | EXTENDED_RELEASE_TABLET | Freq: Once | ORAL | Status: AC
Start: 2022-07-16 — End: 2022-07-16
  Administered 2022-07-16: 40 meq via ORAL
  Filled 2022-07-16: qty 2

## 2022-07-16 MED ORDER — MEDROXYPROGESTERONE ACETATE 10 MG PO TABS: 10.0000 mg | ORAL_TABLET | Freq: Every day | ORAL | 0 refills | Status: DC

## 2022-07-16 MED ORDER — ACETAMINOPHEN 500 MG PO TABS
1000.0000 mg | ORAL_TABLET | Freq: Once | ORAL | Status: AC
  Administered 2022-07-16: 1000 mg via ORAL
  Filled 2022-07-16: qty 2

## 2022-07-16 NOTE — ED Notes (Signed)
Hgb 5.9, Clark, PA notified.

## 2022-07-16 NOTE — ED Notes (Signed)
Pt report received from previous nurse. Pt A&O x4, vitals stable, denies needs/complaints. Call bell in reach. No acute distress noted.  

## 2022-07-16 NOTE — ED Provider Notes (Signed)
[place] EMERGENCY DEPARTMENT AT [facility] Provider Note   CSN: [id number] Arrival date & time: 07/16/22  1024     History  Chief Complaint  Patient presents with   Abnormal Lab   Neck Pain    Katie Garrett is a 42 y.o. female with past medical history significant for headache presents to the ED complaining of a swollen area on the left base of her neck for the past several months.  She went to [facility] 2 days prior for same complaint.  She states she went due to having a sore throat and painful swallowing.  Patient had labs drawn at that visit and was advised to go to ED for further evaluation due to having very low hemoglobin and low vitamin D.  Patient states she is currently on her menstrual cycle, which are typically very heavy for her and last approximately 8 days.  She has fatigue, occasional generalized weakness like she "just can't get out of bed", shortness of breath when ambulating, and headaches.  She reports her place of living has mildew and mold, and she is unsure if this is also a contributing factor.  Denies fever, chest pain, chest tightness, melena, hematochezia, nausea, vomiting, diarrhea, syncope.  She does not have established primary care and has not had imaging of the mass in her neck.         Home Medications Prior to Admission medications   Medication Sig Start Date End Date Taking? Authorizing Provider  medroxyPROGESTERone (PROVERA) 10 MG tablet Take 1 tablet (10 mg total) by mouth daily. 07/16/22  Yes Janeliz Prestwood R, PA-C  potassium chloride SA (KLOR-CON M) 20 MEQ tablet Take 1 tablet (20 mEq total) by mouth 2 (two) times daily. 07/16/22  Yes Evalene Vath R, PA-C  amLODipine (NORVASC) 10 MG tablet Take 1 tablet (10 mg total) by mouth daily. 03/20/20   Wallis Bamberg, PA-C  Aspirin-Acetaminophen-Caffeine (GOODY HEADACHE PO) Take 1 packet by mouth every 8 (eight) hours as needed (headache/pain). Patient not taking: Reported on 05/07/2020     [provider]  diphenhydramine-acetaminophen (TYLENOL PM) 25-500 MG TABS tablet Take 1 tablet by mouth at bedtime as needed.    [provider]  ferrous sulfate 325 (65 FE) MG tablet Take 1 tablet (325 mg total) by mouth every other day. 05/07/20   Adam Phenix, MD  hydrochlorothiazide (HYDRODIURIL) 12.5 MG tablet Take 1 tablet (12.5 mg total) by mouth daily. Patient not taking: No sig reported 09/03/12   Napoleon Form, MD  meclizine (ANTIVERT) 25 MG tablet Take 1 tablet (25 mg total) by mouth 3 (three) times daily as needed for dizziness. 04/05/20   Hall-Potvin, [place], PA-C  naproxen (NAPROSYN) 500 MG tablet Take 1 tablet (500 mg total) by mouth 2 (two) times daily as needed (pain). 10/14/21   Zenia Resides, MD      Allergies    Onion    Review of Systems   Review of Systems  Constitutional:  Positive for fatigue. Negative for chills and fever.  HENT:  Positive for sore throat.   Respiratory:  Positive for shortness of breath. Negative for chest tightness.   Cardiovascular:  Negative for chest pain.  Gastrointestinal:  Negative for abdominal pain, anal bleeding, blood in stool, diarrhea, nausea and vomiting.  Musculoskeletal:  Positive for neck pain.  Neurological:  Positive for weakness and headaches. Negative for dizziness, syncope and light-headedness.    Physical Exam Updated Vital Signs BP (!) 144/91   Pulse  95   Temp 97.9 F (36.6 C) (Oral)   Resp 16   LMP 07/16/2022 (Exact Date)   SpO2 99%  Physical Exam Vitals and nursing note reviewed.  Constitutional:      General: She is not in acute distress.    Appearance: Normal appearance. She is not ill-appearing or diaphoretic.  HENT:     Mouth/Throat:     Mouth: Mucous membranes are moist.     Pharynx: Oropharynx is clear. Uvula midline. No posterior oropharyngeal erythema.  Eyes:     Extraocular Movements: Extraocular movements intact.     Conjunctiva/sclera: Conjunctivae normal.     Pupils:  Pupils are equal, round, and reactive to light.  Neck:     Thyroid: Thyroid mass (left side) and thyroid tenderness present.     Trachea: Trachea and phonation normal.  Cardiovascular:     Rate and Rhythm: Normal rate and regular rhythm.  Pulmonary:     Effort: Pulmonary effort is normal. No tachypnea, accessory muscle usage or respiratory distress.     Breath sounds: Normal breath sounds and air entry.  Abdominal:     General: Abdomen is flat.     Palpations: Abdomen is soft.     Tenderness: There is no abdominal tenderness.  Musculoskeletal:     Cervical back: Full passive range of motion without pain.     Right lower leg: No edema.     Left lower leg: No edema.  Lymphadenopathy:     Cervical: No cervical adenopathy.  Skin:    General: Skin is warm and dry.     Capillary Refill: Capillary refill takes less than 2 seconds.     Coloration: Skin is not pale.  Neurological:     Mental Status: She is alert. Mental status is at baseline.  Psychiatric:        Mood and Affect: Mood normal.        Behavior: Behavior normal.     ED Results / Procedures / Treatments   Labs (all labs ordered are listed, but only abnormal results are displayed) Labs Reviewed  CBC WITH DIFFERENTIAL/PLATELET - Abnormal; Notable for the following components:      Result Value   RBC 3.80 (*)    Hemoglobin 5.9 (*)    HCT 22.6 (*)    MCV 59.5 (*)    MCH 15.5 (*)    MCHC 26.1 (*)    RDW 23.4 (*)    All other components within normal limits  BASIC METABOLIC PANEL - Abnormal; Notable for the following components:   Potassium 2.9 (*)    Glucose, Bld 103 (*)    All other components within normal limits  IRON AND TIBC - Abnormal; Notable for the following components:   Iron <5 (*)    TIBC 466 (*)    All other components within normal limits  FERRITIN - Abnormal; Notable for the following components:   Ferritin 1 (*)    All other components within normal limits  RETICULOCYTES - Abnormal; Notable for  the following components:   RBC. 3.42 (*)    All other components within normal limits  MAGNESIUM  VITAMIN B12  FOLATE  HEMOGLOBIN AND HEMATOCRIT, BLOOD  I-STAT BETA HCG BLOOD, ED ([facility], [facility], [facility] ONLY)  TYPE AND SCREEN  ABO/RH  PREPARE RBC (CROSSMATCH)    EKG None  Radiology CT Soft Tissue Neck W Contrast  Result Date: 07/16/2022 CLINICAL DATA:  Soft tissue infection suspected, neck, xray done EXAM: CT NECK WITH CONTRAST  TECHNIQUE: Multidetector CT imaging of the neck was performed using the standard protocol following the bolus administration of intravenous contrast. RADIATION DOSE REDUCTION: This exam was performed according to the departmental dose-optimization program which includes automated exposure control, adjustment of the mA and/or kV according to patient size and/or use of iterative reconstruction technique. CONTRAST:  36mL OMNIPAQUE IOHEXOL 350 MG/ML SOLN COMPARISON:  None Available. FINDINGS: Pharynx and larynx: Normal. No mass or swelling. Salivary glands: No inflammation, mass, or stone. Thyroid: Enlarged and heterogeneous thyroid, including dominant nodule in the anterior left thyroid measuring 3.6 cm. Lymph nodes: None enlarged or abnormal density. Vascular: Limited evaluation due to non arterial timing Limited intracranial: Negative. Visualized orbits: Negative. Mastoids and visualized paranasal sinuses: Clear. Skeleton: No acute abnormality. Upper chest: Visualized lung apices are clear. IMPRESSION: 1. Enlarged and heterogeneous thyroid, including dominant nodule in the anterior left thyroid measuring 3.6 cm. Recommend thyroid ultrasound (ref: J Am Coll Radiol. 2015 Feb;12(2): 143-50). 2. Otherwise, unremarkable neck without visible edema or discrete fluid collection. Electronically Signed   By: Feliberto Harts M.D.   On: 07/16/2022 13:25   DG Chest 2 View  Result Date: 07/16/2022 CLINICAL DATA:  Cough. EXAM: CHEST - 2 VIEW COMPARISON:  Chest x-ray Aug 21, 2015. FINDINGS: The  heart size and mediastinal contours are within normal limits. Both lungs are clear. No visible pleural effusions or pneumothorax. No acute osseous abnormality. IMPRESSION: No active cardiopulmonary disease. Electronically Signed   By: Feliberto Harts M.D.   On: 07/16/2022 11:08    Procedures .Critical Care  Performed by: Lenard Simmer, PA-C Authorized by: Lenard Simmer, PA-C   Critical care provider statement:    Critical care time (minutes):  42   Critical care was necessary to treat or prevent imminent or life-threatening deterioration of the following conditions:  Circulatory failure   Critical care was time spent personally by me on the following activities:  Development of treatment plan with patient or surrogate, discussions with consultants, evaluation of patient's response to treatment, examination of patient, ordering and review of laboratory studies, ordering and review of radiographic studies, ordering and performing treatments and interventions, pulse oximetry, re-evaluation of patient's condition, review of old charts and obtaining history from patient or surrogate     Medications Ordered in ED Medications  potassium chloride 10 mEq in 100 mL IVPB (0 mEq Intravenous Stopped 07/16/22 1709)  potassium chloride SA (KLOR-CON M) CR tablet 40 mEq (40 mEq Oral Given 07/16/22 1504)  0.9 %  sodium chloride infusion (Manually program via Guardrails IV Fluids) ( Intravenous New Bag/Given 07/16/22 1939)  iohexol (OMNIPAQUE) 350 MG/ML injection 75 mL (75 mLs Intravenous Contrast Given 07/16/22 1309)  acetaminophen (TYLENOL) tablet 1,000 mg (1,000 mg Oral Given 07/16/22 1555)  ketorolac (TORADOL) 30 MG/ML injection 30 mg (30 mg Intravenous Given 07/16/22 2041)    ED Course/ Medical Decision Making/ A&P Clinical Course as of 07/16/22 2046  Sat Jul 16, 2022  1133 Hemoglobin(!!): 5.9 [JL]  2030 FU on repeat Hgb and reassess. [BH]    Clinical Course User Index [BH] Henderly, Britni A,  PA-C [JL] Ernie Avena, MD                             Medical Decision Making Amount and/or Complexity of Data Reviewed Labs: ordered. Radiology: ordered.   This patient presents to the ED with chief complaint(s) of fatigue, sore throat, shortness of breath, headache, tender left-sided neck mass  weakness with pertinent past medical history of headaches.  The complaint involves an extensive differential diagnosis and also carries with it a high risk of complications and morbidity.    The differential diagnosis includes anemia, electrolyte derangement, cervical lymphadenopathy, malignancy, thyroid nodule  The initial plan is to obtain baseline labs and CT imaging of the neck  Additional history obtained: Records reviewed  [facility] urgent care note from 07/13/2022.  Patient was seen for similar symptoms and blood work was done.  Thyroid panel at this visit was completely normal.  Hemoglobin was 6.1 and vitamin D was extremely deficient at 5.1.  Patient did have elevated sed rate.  It was recommended patient go to ED for further workup and evaluation and possible blood transfusion.  Reviewed previous OBGYN records from 2022.  Patient has uterine fibroids with anemia secondary to blood loss.  Was recommended at that time to start iron supplementation.   Initial Assessment:   On exam, patient is resting comfortably in bed and does not appear to be in acute distress.  She is not ill-appearing.  Heart rate is normal in the 90s with regular rhythm.  There is a palpable mass on the left side of the neck consistent with a thyroid mass.  She does have mild tenderness to palpation of this region.  She has no difficulty swallowing, but does experience increased discomfort.  Trachea and phonation are normal.  Independent ECG/labs interpretation:  The following labs were independently interpreted:  CBC significant for very low hemoglobin at 5.9.  Appears to be microcytic anemia.  No  leukocytosis. Metabolic panel with hypokalemia, no other electrolyte disturbance.  Renal function is normal. Iron and ferritin both very low with elevated TIBC.    Independent visualization and interpretation of imaging: I independently visualized the following imaging with scope of interpretation limited to determining acute life threatening conditions related to emergency care: CT soft tissue neck, which revealed heterogeneous thyroid with dominant left-sided nodule.  No evidence of fluid collection or cervical lymphadenopathy.  I agree with radiologist interpretation.  Treatment and Reassessment: Patient was given 2 units of blood due to having symptomatic anemia with a hemoglobin less than 7. She continues to have headache, Tylenol given to patient.  She had minimal improvement in headache symptoms, will try Toradol.   Consultations Obtained: I requested consultation with on-call OBGYN provider and spoke with Dr. Seymour Bars regarding patient's heavy menstrual bleeding.  She recommended 10 mg Provera daily and outpatient follow up as soon as possible.   Disposition:   8:46 PM Care transferred to [facility], PA-C at the end of my shift as the patient will require reassessment once labs/imaging have resulted. Patient presentation, ED course, and plan of care discussed with review of all pertinent labs and imaging. Please see his/her note for further details regarding further ED course and disposition. Plan at time of handoff is repeat H&H to reassess hemoglobin and ensure it is stable following blood transfusion.  Patient instructed to follow-up with gynecology and establish PCP care.  Provera and potassium sent to patient's pharmacy.  Advised patient to begin taking iron and vitamin D daily. Patient will be appropriate for discharge home if hemoglobin has shown improvement and patient is hemodynamically stable.            Final Clinical Impression(s) / ED Diagnoses Final diagnoses:   Symptomatic anemia  Menorrhagia with regular cycle  Bad headache  Hypokalemia    Rx / DC Orders ED Discharge Orders  Ordered    medroxyPROGESTERone (PROVERA) 10 MG tablet  Daily        07/16/22 2028    potassium chloride SA (KLOR-CON M) 20 MEQ tablet  2 times daily        07/16/22 2042              Barrie Lyme 07/16/22 2046    Ernie Avena, MD 07/16/22 2106

## 2022-07-16 NOTE — ED Provider Notes (Signed)
Care assumed from previous shift change.  See note for full HPI.  In summation 42 year old here for evaluation of thyroid nodule.  [facility] who recommended coming here to the emergency department.  Also admitted to some fatigue and generalized weakness.  Was found to have a new anemia 5.9.  With known history of anemia due to uterine fibroids and heavy menstrual cycles.  Has not been on iron supplementation plan to give 2 units of blood, recheck H&H if patient feels well repeat hemoglobin improved can DC home.    Physical Exam  BP (!) 159/104   Pulse 82   Temp 98 F (36.7 C) (Oral)   Resp 18   LMP 07/16/2022 (Exact Date)   SpO2 100%   Physical Exam Vitals and nursing note reviewed.  Constitutional:      General: She is not in acute distress.    Appearance: She is well-developed. She is not ill-appearing.  HENT:     Head: Atraumatic.  Eyes:     Pupils: Pupils are equal, round, and reactive to light.  Cardiovascular:     Rate and Rhythm: Normal rate.  Pulmonary:     Effort: No respiratory distress.  Abdominal:     General: There is no distension.  Musculoskeletal:        General: Normal range of motion.     Cervical back: Normal range of motion.  Skin:    General: Skin is warm and dry.  Neurological:     General: No focal deficit present.     Mental Status: She is alert.  Psychiatric:        Mood and Affect: Mood normal.     Procedures  Procedures Labs Reviewed  CBC WITH DIFFERENTIAL/PLATELET - Abnormal; Notable for the following components:      Result Value   RBC 3.80 (*)    Hemoglobin 5.9 (*)    HCT 22.6 (*)    MCV 59.5 (*)    MCH 15.5 (*)    MCHC 26.1 (*)    RDW 23.4 (*)    All other components within normal limits  BASIC METABOLIC PANEL - Abnormal; Notable for the following components:   Potassium 2.9 (*)    Glucose, Bld 103 (*)    All other components within normal limits  IRON AND TIBC - Abnormal; Notable for the following components:   Iron <5  (*)    TIBC 466 (*)    All other components within normal limits  FERRITIN - Abnormal; Notable for the following components:   Ferritin 1 (*)    All other components within normal limits  RETICULOCYTES - Abnormal; Notable for the following components:   RBC. 3.42 (*)    All other components within normal limits  HEMOGLOBIN AND HEMATOCRIT, BLOOD - Abnormal; Notable for the following components:   Hemoglobin 8.3 (*)    HCT 27.5 (*)    All other components within normal limits  MAGNESIUM  VITAMIN B12  FOLATE  I-STAT BETA HCG BLOOD, ED ([facility], [facility], [facility] ONLY)  TYPE AND SCREEN  ABO/RH  PREPARE RBC (CROSSMATCH)   CT Soft Tissue Neck W Contrast  Result Date: 07/16/2022 CLINICAL DATA:  Soft tissue infection suspected, neck, xray done EXAM: CT NECK WITH CONTRAST TECHNIQUE: Multidetector CT imaging of the neck was performed using the standard protocol following the bolus administration of intravenous contrast. RADIATION DOSE REDUCTION: This exam was performed according to the departmental dose-optimization program which includes automated exposure control, adjustment of the  mA and/or kV according to patient size and/or use of iterative reconstruction technique. CONTRAST:  82mL OMNIPAQUE IOHEXOL 350 MG/ML SOLN COMPARISON:  None Available. FINDINGS: Pharynx and larynx: Normal. No mass or swelling. Salivary glands: No inflammation, mass, or stone. Thyroid: Enlarged and heterogeneous thyroid, including dominant nodule in the anterior left thyroid measuring 3.6 cm. Lymph nodes: None enlarged or abnormal density. Vascular: Limited evaluation due to non arterial timing Limited intracranial: Negative. Visualized orbits: Negative. Mastoids and visualized paranasal sinuses: Clear. Skeleton: No acute abnormality. Upper chest: Visualized lung apices are clear. IMPRESSION: 1. Enlarged and heterogeneous thyroid, including dominant nodule in the anterior left thyroid measuring 3.6 cm. Recommend thyroid ultrasound (ref: J Am  Coll Radiol. 2015 Feb;12(2): 143-50). 2. Otherwise, unremarkable neck without visible edema or discrete fluid collection. Electronically Signed   By: Feliberto Harts M.D.   On: 07/16/2022 13:25   DG Chest 2 View  Result Date: 07/16/2022 CLINICAL DATA:  Cough. EXAM: CHEST - 2 VIEW COMPARISON:  Chest x-ray Aug 21, 2015. FINDINGS: The heart size and mediastinal contours are within normal limits. Both lungs are clear. No visible pleural effusions or pneumothorax. No acute osseous abnormality. IMPRESSION: No active cardiopulmonary disease. Electronically Signed   By: Feliberto Harts M.D.   On: 07/16/2022 11:08    ED Course / MDM   Clinical Course as of 07/16/22 2355  Sat Jul 16, 2022  1133 Hemoglobin(!!): 5.9 [JL]  2030 FU on repeat Hgb and reassess. [BH]    Clinical Course User Index [BH] Kadesia Robel A, PA-C [JL] Ernie Avena, MD   Care assumed from previous provider at shift change.  See note for full HPI.  42 year old here for evaluation of abnormal labs found to have a new hemoglobin of 5.9.  Known history of uterine fibroids and heavy menstrual cycles currently on cycle.  Plan to give 2 units of blood, recheck H&H and reassess.  If feels improved can DC home  Patient reassessed.  Feels significantly improved.  Ambulatory, dynamically stable.  Repeat hemoglobin 8.3.  Discussed starting Megace per OB/GYN's recommendation and close follow-up outpatient.  She is agreeable.  The patient has been appropriately medically screened and/or stabilized in the ED. I have low suspicion for any other emergent medical condition which would require further screening, evaluation or treatment in the ED or require inpatient management.  Patient is hemodynamically stable and in no acute distress.  Patient able to ambulate in department prior to ED.  Evaluation does not show acute pathology that would require ongoing or additional emergent interventions while in the emergency department or further  inpatient treatment.  I have discussed the diagnosis with the patient and answered all questions.  Pain is been managed while in the emergency department and patient has no further complaints prior to discharge.  Patient is comfortable with plan discussed in room and is stable for discharge at this time.  I have discussed strict return precautions for returning to the emergency department.  Patient was encouraged to follow-up with PCP/specialist refer to at discharge.     Medical Decision Making Amount and/or Complexity of Data Reviewed Labs: ordered. Decision-making details documented in ED Course. Radiology: ordered.  Risk OTC drugs. Prescription drug management.          Etoy Mcdonnell A, PA-C 07/16/22 2355    Glyn Ade, MD 07/17/22 1454

## 2022-07-16 NOTE — Discharge Instructions (Addendum)
Thank you for allowing me to be part of your care today.  You received 2 units of blood while in the ED for symptomatic anemia.  You will need to start taking daily iron supplements, 65 mg (ferrous sulfate 325 mg).    Per gynecology's recommendation, I have sent over Provera 10 mg for you to begin taking daily until you are able to follow-up with your gynecologist.  Start taking this medication daily as soon as possible to slow/stop your menstrual bleeding.    I have also sent a prescription for potassium to your pharmacy for you to take over the next several days.  I recommend taking this with food to avoid stomach upset.  I recommend incorporating high potassium foods into your diet as well.   Your vitamin D was also very low.  Please begin taking a daily vitamin D supplement.  I recommend 5000 IU (125 mcg) daily.    To help you establish primary care, I provided the [facility] phone number.  Please call this phone number on Monday to schedule a follow-up appointment.  You will need ultrasound of your thyroid.  Return to the ED if you have worsening of your symptoms or if you have any new concerns.

## 2022-07-16 NOTE — ED Notes (Signed)
New bag of blood started, pt in no acute distress. Vitals stable. Call bell in reach.

## 2022-07-16 NOTE — ED Triage Notes (Signed)
Pt reports swollen area on L anterior base of neck, for which she went to [facility] 2 days ago. Pt advised by staff there to come to ED for further evaluation of same. She also had blood work done at that visit that she states showed low hemoglobin and low vitamin C levels. Is on her period now which is normally very heavy, and also occasionally has blood-tinged mucus when she coughs.

## 2022-07-17 LAB — TYPE AND SCREEN
ABO/RH(D): O POS
Antibody Screen: NEGATIVE
Unit division: 0
Unit division: 0

## 2022-07-17 LAB — BPAM RBC
Blood Product Expiration Date: 202405082359
Blood Product Expiration Date: 202405082359
ISSUE DATE / TIME: 202404131550
ISSUE DATE / TIME: 202404132111
Unit Type and Rh: 5100
Unit Type and Rh: 5100

## 2022-07-17 NOTE — ED Notes (Signed)
Pt blood unit complete, assessed pt, no acute distress noted. No s/s of reaction occurred. Vitals stable.

## 2022-07-17 NOTE — ED Notes (Signed)
Pt blood unit 1 completed, no s/s of reaction occurred. Pt vitals stable, no acute distress noted. Call bell in reach, awaiting 2nd unit of blood.

## 2022-07-28 ENCOUNTER — Ambulatory Visit (INDEPENDENT_AMBULATORY_CARE_PROVIDER_SITE_OTHER): Payer: Medicaid Other | Admitting: Obstetrics & Gynecology

## 2022-07-28 ENCOUNTER — Encounter: Payer: Self-pay | Admitting: Obstetrics & Gynecology

## 2022-07-28 VITALS — BP 141/91 | HR 91 | Ht 61.0 in | Wt 130.9 lb

## 2022-07-28 DIAGNOSIS — D259 Leiomyoma of uterus, unspecified: Secondary | ICD-10-CM

## 2022-07-28 DIAGNOSIS — N920 Excessive and frequent menstruation with regular cycle: Secondary | ICD-10-CM | POA: Insufficient documentation

## 2022-07-28 MED ORDER — MEDROXYPROGESTERONE ACETATE 10 MG PO TABS
10.0000 mg | ORAL_TABLET | Freq: Every day | ORAL | 2 refills | Status: DC
Start: 2022-07-28 — End: 2023-01-31

## 2022-07-28 NOTE — Progress Notes (Signed)
Patient presents for AUB. Patient is currently taking megace and it is helping her bleeding. Patient is wanting to continuous  fibroid management. Denies having cramping.

## 2022-07-28 NOTE — Progress Notes (Signed)
Patient ID: Katie Garrett, female   DOB: 1980/06/13, 42 y.o.   MRN: [id number]  Chief Complaint  Patient presents with   Gynecologic Exam    HPI Katie Garrett is a 42 y.o. female.  She has progressively heavier regular menses with no pain. Flow lasts for 8 days with days 1-3 most heavy. No cramps. She was seen in ED for DUB and Provera was prescribed and there is no bleeding now. HPI  Past Medical History:  Diagnosis Date   Chlamydia    Headache(784.0)    Pregnancy induced hypertension    Shortness of breath     Past Surgical History:  Procedure Laterality Date   CESAREAN SECTION     CESAREAN SECTION WITH BILATERAL TUBAL LIGATION Bilateral 09/01/2012   Procedure: CESAREAN SECTION WITH BILATERAL TUBAL LIGATION;  Surgeon: Tilda Burrow, MD;  Location: [facility];  Service: Obstetrics;  Laterality: Bilateral;   TOOTH EXTRACTION     TUBAL LIGATION  2014    Family History  Problem Relation Age of Onset   Hypertension Mother    Cancer Mother    Hypertension Father     Social History Social History   Tobacco Use   Smoking status: Former    Packs/day: .25    Types: Cigarettes    Quit date: 04/01/2012    Years since quitting: 10.3   Smokeless tobacco: Never  Vaping Use   Vaping Use: Never used  Substance Use Topics   Alcohol use: No   Drug use: No    Allergies  Allergen Reactions   Onion Anaphylaxis and Swelling    Current Outpatient Medications  Medication Sig Dispense Refill   acetaminophen (TYLENOL) 500 MG tablet Take 500 mg by mouth every 6 (six) hours as needed for moderate pain.     potassium chloride SA (KLOR-CON M) 20 MEQ tablet Take 1 tablet (20 mEq total) by mouth 2 (two) times daily. 10 tablet 0   amLODipine (NORVASC) 10 MG tablet Take 1 tablet (10 mg total) by mouth daily. (Patient not taking: Reported on 07/16/2022) 90 tablet 0   amoxicillin-clavulanate (AUGMENTIN) 875-125 MG tablet Take 1 tablet by mouth 2 (two) times daily.      ferrous sulfate 325 (65 FE) MG tablet Take 1 tablet (325 mg total) by mouth every other day. (Patient not taking: Reported on 07/16/2022) 30 tablet 3   hydrochlorothiazide (HYDRODIURIL) 12.5 MG tablet Take 1 tablet (12.5 mg total) by mouth daily. (Patient not taking: Reported on 07/16/2022) 30 tablet 1   meclizine (ANTIVERT) 25 MG tablet Take 1 tablet (25 mg total) by mouth 3 (three) times daily as needed for dizziness. (Patient not taking: Reported on 07/16/2022) 30 tablet 0   medroxyPROGESTERone (PROVERA) 10 MG tablet Take 1 tablet (10 mg total) by mouth daily. 30 tablet 2   naproxen (NAPROSYN) 500 MG tablet Take 1 tablet (500 mg total) by mouth 2 (two) times daily as needed (pain). (Patient not taking: Reported on 07/16/2022) 30 tablet 0   No current facility-administered medications for this visit.    Review of Systems Review of Systems  Constitutional: Negative.   Respiratory: Negative.    Cardiovascular: Negative.   Gastrointestinal: Negative.   Genitourinary:  Positive for menstrual problem. Negative for dysuria and pelvic pain.    Blood pressure (!) 141/91, pulse 91, height  (1.549 m), weight 130 lb 14.4 oz (59.4 kg), last menstrual period 07/16/2022.  Physical Exam Physical Exam Vitals and nursing note reviewed. Exam conducted with  a chaperone present.  Constitutional:      Appearance: Normal appearance.  HENT:     Head: Normocephalic and atraumatic.  Cardiovascular:     Rate and Rhythm: Normal rate.  Pulmonary:     Effort: Pulmonary effort is normal.  Genitourinary:    General: Normal vulva.     Exam position: Lithotomy position.     Vagina: Normal.     Cervix: Normal.     Uterus: Enlarged.      Adnexa: Right adnexa normal and left adnexa normal.     Comments: 10+ weeks, irregular, larger on the left Skin:    General: Skin is warm and dry.     Coloration: Skin is not pale.  Neurological:     Mental Status: She is alert.  Psychiatric:        Mood and Affect:  Mood normal.        Behavior: Behavior normal.     Data Reviewed Narrative & Impression  CLINICAL DATA:  BILATERAL pelvic pain since Thursday, pelvic mass, abnormal CT; LMP 03/23/2020   EXAM: TRANSABDOMINAL AND TRANSVAGINAL ULTRASOUND OF PELVIS   DOPPLER ULTRASOUND OF OVARIES   TECHNIQUE: Both transabdominal and transvaginal ultrasound examinations of the pelvis were performed. Transabdominal technique was performed for global imaging of the pelvis including uterus, ovaries, adnexal regions, and pelvic cul-de-sac.   It was necessary to proceed with endovaginal exam following the transabdominal exam to visualize the endometrium and ovaries. Color and duplex Doppler ultrasound was utilized to evaluate blood flow to the ovaries.   COMPARISON:  CT abdomen and pelvis 03/29/2020   FINDINGS: Uterus   Measurements: 12.3 x 5.4 x 9.1 LEFT fundal subserosal leiomyoma 4.9 x 4.9 x 5.9 cm and an anterior RIGHT subserosal leiomyoma 2.6 x 2.3 x 2.8 cm. The LEFT fundal leiomyoma 5.9 cm diameter corresponds to mass identified on CT.   Endometrium   Thickness: 17 mm.  No endometrial fluid or focal abnormality   Right ovary   Measurements: 3.5 x 1.9 x 1.5 cm = volume: 5.5 mL. Normal morphology without mass. Internal blood flow present on color Doppler imaging.   Left ovary   Measurements: 4.1 x 2.9 x 2.8 cm = volume: 17.3 mL. Normal morphology without mass. Internal blood flow present on color Doppler imaging.   Pulsed Doppler evaluation of both ovaries demonstrates normal low-resistance arterial and venous waveforms.   Other findings   Trace free pelvic fluid.  No adnexal masses.   IMPRESSION: Two uterine leiomyomata, largest of which is 5.9 cm diameter at the LEFT fundus and corresponds to the CT finding.   Otherwise normal appearing uterus and ovaries.   No sonographic evidence of ovarian mass or torsion.     Electronically Signed   By: Ulyses Southward M.D.   On:  03/29/2020 11:42      Assessment Menorrhagia with regular cycle - Plan: US PELVIC COMPLETE WITH TRANSVAGINAL, medroxyPROGESTERone (PROVERA) 10 MG tablet  Uterine leiomyoma, unspecified location - Plan: US PELVIC COMPLETE WITH TRANSVAGINAL, medroxyPROGESTERone (PROVERA) 10 MG tablet   Plan Continue Provera and RTC after f/u US to discuss further management She prefers no hysterectomy and fibroid reduction surgery was discussed   Scheryl Darter 07/28/2022, 11:50 AM

## 2022-08-05 ENCOUNTER — Ambulatory Visit (HOSPITAL_BASED_OUTPATIENT_CLINIC_OR_DEPARTMENT_OTHER)
Admission: RE | Admit: 2022-08-05 | Discharge: 2022-08-05 | Disposition: A | Discharge status: Home / Self Care | Payer: Medicaid Other | Source: Ambulatory Visit | Attending: Obstetrics & Gynecology | Admitting: Obstetrics & Gynecology

## 2022-08-05 DIAGNOSIS — D259 Leiomyoma of uterus, unspecified: Secondary | ICD-10-CM | POA: Diagnosis present

## 2022-08-05 DIAGNOSIS — N920 Excessive and frequent menstruation with regular cycle: Secondary | ICD-10-CM | POA: Diagnosis present

## 2022-08-16 ENCOUNTER — Ambulatory Visit (INDEPENDENT_AMBULATORY_CARE_PROVIDER_SITE_OTHER): Payer: Medicaid Other | Admitting: Family Medicine

## 2022-08-16 ENCOUNTER — Encounter (HOSPITAL_BASED_OUTPATIENT_CLINIC_OR_DEPARTMENT_OTHER): Payer: Self-pay | Admitting: Family Medicine

## 2022-08-16 VITALS — BP 149/102 | HR 97 | Ht 61.0 in | Wt 131.2 lb

## 2022-08-16 DIAGNOSIS — E876 Hypokalemia: Secondary | ICD-10-CM

## 2022-08-16 DIAGNOSIS — E041 Nontoxic single thyroid nodule: Secondary | ICD-10-CM | POA: Diagnosis not present

## 2022-08-16 DIAGNOSIS — I10 Essential (primary) hypertension: Secondary | ICD-10-CM | POA: Diagnosis not present

## 2022-08-16 DIAGNOSIS — E559 Vitamin D deficiency, unspecified: Secondary | ICD-10-CM

## 2022-08-16 DIAGNOSIS — D500 Iron deficiency anemia secondary to blood loss (chronic): Secondary | ICD-10-CM | POA: Diagnosis not present

## 2022-08-16 DIAGNOSIS — Z7689 Persons encountering health services in other specified circumstances: Secondary | ICD-10-CM

## 2022-08-16 DIAGNOSIS — N920 Excessive and frequent menstruation with regular cycle: Secondary | ICD-10-CM

## 2022-08-16 MED ORDER — AMLODIPINE BESYLATE 10 MG PO TABS
10.0000 mg | ORAL_TABLET | Freq: Every day | ORAL | 1 refills | Status: DC
Start: 2022-08-16 — End: 2023-02-02

## 2022-08-16 MED ORDER — POTASSIUM CHLORIDE CRYS ER 20 MEQ PO TBCR
20.0000 meq | EXTENDED_RELEASE_TABLET | Freq: Two times a day (BID) | ORAL | 0 refills | Status: DC
Start: 2022-08-16 — End: 2022-10-21

## 2022-08-16 MED ORDER — FERROUS SULFATE 325 (65 FE) MG PO TABS
325.0000 mg | ORAL_TABLET | ORAL | 0 refills | Status: DC
Start: 2022-08-16 — End: 2022-12-19

## 2022-08-16 NOTE — Progress Notes (Unsigned)
New Patient Office Visit  Subjective    Patient ID: Katie Garrett, female    DOB: 03-15-1981  Age: 42 y.o. MRN: [id number]  HPI Katie Garrett is a 42 yo female who presents to establish care.   Had a sore throat back in April and went to [facility] to have her thyroid checked out- reports that there was a mass on the left side of her neck. Reports it changes size sometimes-- getting larger and smaller. [facility] received her results of significantly low H&H and was advised to go to the ED.  Anemia- due to uterine fibroids and heavy menstrual cycles  Received blood transfusion 07/16/2022 Currently taking OTC iron Provera 10mg  daily   Outpatient Encounter Medications as of 08/16/2022  Medication Sig   medroxyPROGESTERone (PROVERA) 10 MG tablet Take 1 tablet (10 mg total) by mouth daily.   [DISCONTINUED] acetaminophen (TYLENOL) 500 MG tablet Take 500 mg by mouth every 6 (six) hours as needed for moderate pain.   [DISCONTINUED] amLODipine (NORVASC) 10 MG tablet Take 1 tablet (10 mg total) by mouth daily.   [DISCONTINUED] amoxicillin-clavulanate (AUGMENTIN) 875-125 MG tablet Take 1 tablet by mouth 2 (two) times daily.   [DISCONTINUED] ferrous sulfate 325 (65 FE) MG tablet Take 1 tablet (325 mg total) by mouth every other day.   [DISCONTINUED] meclizine (ANTIVERT) 25 MG tablet Take 1 tablet (25 mg total) by mouth 3 (three) times daily as needed for dizziness.   [DISCONTINUED] potassium chloride SA (KLOR-CON M) 20 MEQ tablet Take 1 tablet (20 mEq total) by mouth 2 (two) times daily.   amLODipine (NORVASC) 10 MG tablet Take 1 tablet (10 mg total) by mouth daily.   ferrous sulfate 325 (65 FE) MG tablet Take 1 tablet (325 mg total) by mouth every other day.   potassium chloride SA (KLOR-CON M) 20 MEQ tablet Take 1 tablet (20 mEq total) by mouth 2 (two) times daily.   [DISCONTINUED] hydrochlorothiazide (HYDRODIURIL) 12.5 MG tablet Take 1 tablet (12.5 mg total) by mouth daily.  (Patient not taking: Reported on 07/16/2022)   [DISCONTINUED] naproxen (NAPROSYN) 500 MG tablet Take 1 tablet (500 mg total) by mouth 2 (two) times daily as needed (pain). (Patient not taking: Reported on 07/16/2022)   No facility-administered encounter medications on file as of 08/16/2022.    Past Medical History:  Diagnosis Date   Chlamydia    Headache(784.0)    Pregnancy induced hypertension    Shortness of breath     Past Surgical History:  Procedure Laterality Date   CESAREAN SECTION     CESAREAN SECTION WITH BILATERAL TUBAL LIGATION Bilateral 09/01/2012   Procedure: CESAREAN SECTION WITH BILATERAL TUBAL LIGATION;  Surgeon: Tilda Burrow, MD;  Location: [facility];  Service: Obstetrics;  Laterality: Bilateral;   TOOTH EXTRACTION     TUBAL LIGATION  2014    Family History  Problem Relation Age of Onset   Hypertension Mother    Cancer Mother    Hypertension Father     Social History   Socioeconomic History   Marital status: Widowed    Spouse name: Not on file   Number of children: Not on file   Years of education: Not on file   Highest education level: Not on file  Occupational History   Not on file  Tobacco Use   Smoking status: Former    Packs/day: .25    Types: Cigarettes    Quit date: 04/01/2012    Years since quitting: 10.3   Smokeless  tobacco: Never  Vaping Use   Vaping Use: Never used  Substance and Sexual Activity   Alcohol use: No   Drug use: No   Sexual activity: Yes    Partners: Male    Birth control/protection: Surgical  Other Topics Concern   Not on file  Social History Narrative   Not on file   Social Determinants of Health   Financial Resource Strain: Not on file  Food Insecurity: Not on file  Transportation Needs: Not on file  Physical Activity: Not on file  Stress: Not on file  Social Connections: Not on file  Intimate Partner Violence: Not on file    Review of Systems  Constitutional:  Negative for malaise/fatigue.  Eyes:   Negative for blurred vision and double vision.  Respiratory:  Negative for cough and shortness of breath.   Cardiovascular:  Negative for chest pain and palpitations.  Gastrointestinal:  Negative for abdominal pain, nausea and vomiting.  Musculoskeletal:  Negative for myalgias.  Neurological:  Negative for dizziness, weakness and headaches.  Psychiatric/Behavioral:  Negative for depression and suicidal ideas. The patient is not nervous/anxious.     Objective   BP (!) 149/102   Pulse 97   Ht 5\' 1"  (1.549 m)   Wt 131 lb 3.2 oz (59.5 kg)   LMP 07/16/2022 (Exact Date)   SpO2 100%   BMI 24.79 kg/m   Physical Exam Constitutional:      Appearance: Normal appearance.  Cardiovascular:     Rate and Rhythm: Normal rate and regular rhythm.     Pulses: Normal pulses.     Heart sounds: Normal heart sounds.  Pulmonary:     Effort: Pulmonary effort is normal.     Breath sounds: Normal breath sounds.  Neurological:     Mental Status: She is alert.  Psychiatric:        Mood and Affect: Mood normal.        Behavior: Behavior normal.        Thought Content: Thought content normal.        Judgment: Judgment normal.    Assessment & Plan:  1. Encounter to establish care Patient presents today to establish care. She was recently seen in the ED 07/16/2022 for symptomatic anemia and received 2 units of blood. Reviewed the past medical history, family history, social history, surgical history, and allergies today and no changes were needed.    2. Iron deficiency anemia due to chronic blood loss Patient is currently taking ferrous sulfate 325mg  daily. Will plan to recheck CBC and iron studies.  - CBC with Differential/Platelet - Iron, TIBC and Ferritin Panel  3. Hypokalemia Potassium low during hospital stay. Patient reports she was unable to pick up her potassium from the pharmacy since they never received the prescription. Will recheck potassium level today. Will reorder potassium since it was  low.  - Comprehensive metabolic panel - potassium chloride SA (KLOR-CON M) 20 MEQ tablet; Take 1 tablet (20 mEq total) by mouth 2 (two) times daily.  Dispense: 10 tablet; Refill: 0  4. Thyroid nodule Denies symptoms of hypothyroidism and hyperthyroidism. Will check thyroid function today.  - TSH Rfx on Abnormal to Free T4  5. Menorrhagia with regular cycle Patient reports history of fibroids. She is currently taking ferrous sulfate 325mg  daily. Advised her to take stool softeners to help prevent constipation.  - ferrous sulfate 325 (65 FE) MG tablet; Take 1 tablet (325 mg total) by mouth every other day.  Dispense: 90 tablet; Refill:  0  6. Primary hypertension Patient presents today with elevated blood pressure. Patient in no acute distress and is well-appearing. Cardiovascular exam with heart regular rate and rhythm. Normal heart sounds, no murmurs present. No lower extremity edema present. Lungs clear to auscultation bilaterally. Patient reports being out of amlodipine 10mg . Refills provided today. Advised patient to monitor blood pressure at home. Return to office sooner if blood pressure begins to increase greater than 130/80.  - amLODipine (NORVASC) 10 MG tablet; Take 1 tablet (10 mg total) by mouth daily.  Dispense: 90 tablet; Refill: 1    Return in about 3 months (around 11/16/2022) for HTN follow-up.   Alyson Reedy, FNP

## 2022-08-18 LAB — CBC WITH DIFFERENTIAL/PLATELET
Basophils Absolute: 0.1 10*3/uL (ref 0.0–0.2)
Basos: 1 %
EOS (ABSOLUTE): 0.3 10*3/uL (ref 0.0–0.4)
Eos: 3 %
Hematocrit: 33.6 % — ABNORMAL LOW (ref 34.0–46.6)
Hemoglobin: 9.6 g/dL — ABNORMAL LOW (ref 11.1–15.9)
Immature Grans (Abs): 0 10*3/uL (ref 0.0–0.1)
Immature Granulocytes: 0 %
Lymphocytes Absolute: 2 10*3/uL (ref 0.7–3.1)
Lymphs: 24 %
MCH: 20.6 pg — ABNORMAL LOW (ref 26.6–33.0)
MCHC: 28.6 g/dL — ABNORMAL LOW (ref 31.5–35.7)
MCV: 72 fL — ABNORMAL LOW (ref 79–97)
Monocytes Absolute: 0.4 10*3/uL (ref 0.1–0.9)
Monocytes: 5 %
Neutrophils Absolute: 5.5 10*3/uL (ref 1.4–7.0)
Neutrophils: 67 %
Platelets: 415 10*3/uL (ref 150–450)
RBC: 4.66 x10E6/uL (ref 3.77–5.28)
RDW: 27.8 % — ABNORMAL HIGH (ref 11.7–15.4)
WBC: 8.3 10*3/uL (ref 3.4–10.8)

## 2022-08-18 LAB — COMPREHENSIVE METABOLIC PANEL
ALT: 7 IU/L (ref 0–32)
AST: 13 IU/L (ref 0–40)
Albumin/Globulin Ratio: 1.4 (ref 1.2–2.2)
Albumin: 4.1 g/dL (ref 3.9–4.9)
Alkaline Phosphatase: 78 IU/L (ref 44–121)
BUN/Creatinine Ratio: 14 (ref 9–23)
BUN: 12 mg/dL (ref 6–24)
Bilirubin Total: 0.2 mg/dL (ref 0.0–1.2)
CO2: 26 mmol/L (ref 20–29)
Calcium: 9.7 mg/dL (ref 8.7–10.2)
Chloride: 106 mmol/L (ref 96–106)
Creatinine, Ser: 0.84 mg/dL (ref 0.57–1.00)
Globulin, Total: 3 g/dL (ref 1.5–4.5)
Glucose: 79 mg/dL (ref 70–99)
Potassium: 3.5 mmol/L (ref 3.5–5.2)
Sodium: 143 mmol/L (ref 134–144)
Total Protein: 7.1 g/dL (ref 6.0–8.5)
eGFR: 89 mL/min/{1.73_m2} (ref >59)

## 2022-08-18 LAB — IRON,TIBC AND FERRITIN PANEL
Ferritin: 62 ng/mL (ref 15–150)
Iron Saturation: 4 % — CL (ref 15–55)
Iron: 15 ug/dL — ABNORMAL LOW (ref 27–159)
Total Iron Binding Capacity: 402 ug/dL (ref 250–450)
UIBC: 387 ug/dL (ref 131–425)

## 2022-08-18 LAB — VITAMIN D 25 HYDROXY (VIT D DEFICIENCY, FRACTURES): Vit D, 25-Hydroxy: 25.7 ng/mL — ABNORMAL LOW (ref 30.0–100.0)

## 2022-08-18 LAB — TSH RFX ON ABNORMAL TO FREE T4: TSH: 0.829 u[IU]/mL (ref 0.450–4.500)

## 2022-08-23 ENCOUNTER — Other Ambulatory Visit (HOSPITAL_BASED_OUTPATIENT_CLINIC_OR_DEPARTMENT_OTHER): Payer: Self-pay

## 2022-08-23 DIAGNOSIS — E559 Vitamin D deficiency, unspecified: Secondary | ICD-10-CM

## 2022-08-23 DIAGNOSIS — D500 Iron deficiency anemia secondary to blood loss (chronic): Secondary | ICD-10-CM

## 2022-08-25 ENCOUNTER — Ambulatory Visit (INDEPENDENT_AMBULATORY_CARE_PROVIDER_SITE_OTHER): Payer: Medicaid Other | Admitting: Obstetrics & Gynecology

## 2022-08-25 ENCOUNTER — Encounter: Payer: Self-pay | Admitting: Obstetrics & Gynecology

## 2022-08-25 VITALS — BP 155/98 | HR 85 | Ht 61.0 in | Wt 133.0 lb

## 2022-08-25 DIAGNOSIS — N920 Excessive and frequent menstruation with regular cycle: Secondary | ICD-10-CM

## 2022-08-25 DIAGNOSIS — D259 Leiomyoma of uterus, unspecified: Secondary | ICD-10-CM

## 2022-08-25 NOTE — Progress Notes (Signed)
Patient ID: Katie Garrett, female   DOB: 02-03-1981, 42 y.o.   MRN: [id number]  Chief Complaint  Patient presents with   Follow-up    HPI Katie Garrett is a 42 y.o. female.  [id number] Patient's last menstrual period was 08/17/2021 (exact date). She comes for f/u after repeat US for uterine fibroids.  HPI  Past Medical History:  Diagnosis Date   Chlamydia    Headache(784.0)    Pregnancy induced hypertension    Shortness of breath     Past Surgical History:  Procedure Laterality Date   CESAREAN SECTION     CESAREAN SECTION WITH BILATERAL TUBAL LIGATION Bilateral 09/01/2012   Procedure: CESAREAN SECTION WITH BILATERAL TUBAL LIGATION;  Surgeon: Tilda Burrow, MD;  Location: [facility];  Service: Obstetrics;  Laterality: Bilateral;   TOOTH EXTRACTION     TUBAL LIGATION  2014    Family History  Problem Relation Age of Onset   Hypertension Mother    Cancer Mother    Hypertension Father     Social History Social History   Tobacco Use   Smoking status: Former    Packs/day: .25    Types: Cigarettes    Quit date: 04/01/2012    Years since quitting: 10.4   Smokeless tobacco: Never  Vaping Use   Vaping Use: Never used  Substance Use Topics   Alcohol use: No   Drug use: No    Allergies  Allergen Reactions   Onion Anaphylaxis and Swelling    Current Outpatient Medications  Medication Sig Dispense Refill   amLODipine (NORVASC) 10 MG tablet Take 1 tablet (10 mg total) by mouth daily. 90 tablet 1   ferrous sulfate 325 (65 FE) MG tablet Take 1 tablet (325 mg total) by mouth every other day. 90 tablet 0   medroxyPROGESTERone (PROVERA) 10 MG tablet Take 1 tablet (10 mg total) by mouth daily. 30 tablet 2   potassium chloride SA (KLOR-CON M) 20 MEQ tablet Take 1 tablet (20 mEq total) by mouth 2 (two) times daily. 10 tablet 0   No current facility-administered medications for this visit.    Review of Systems Review of Systems  Genitourinary:  Positive  for menstrual problem and pelvic pain. Negative for vaginal bleeding.    Blood pressure (!) 155/98, pulse 85, height 5\' 1"  (1.549 m), weight 133 lb (60.3 kg), last menstrual period 08/17/2021.  Physical Exam Physical Exam Vitals and nursing note reviewed.  Constitutional:      Appearance: Normal appearance.     Data Reviewed  Narrative & Impression  CLINICAL DATA:  Uterine fibroids, LMP 07/16/2022   EXAM: TRANSABDOMINAL AND TRANSVAGINAL ULTRASOUND OF PELVIS   TECHNIQUE: Both transabdominal and transvaginal ultrasound examinations of the pelvis were performed. Transabdominal technique was performed for global imaging of the pelvis including uterus, ovaries, adnexal regions, and pelvic cul-de-sac. It was necessary to proceed with endovaginal exam following the transabdominal exam to visualize the endometrium and ovaries.   COMPARISON:  03/29/2020   FINDINGS: Uterus   Measurements: 10.9 x 4.8 x 6.7 cm = volume: 183 mL. Anteverted. Heterogeneous myometrium. Nabothian cyst at cervix. Several masses are present consistent with leiomyomata. Largest of these is 4.4 cm diameter posterior LEFT upper uterus, likely extends submucosal. Smaller leiomyomata 2.7 cm posteriorly and 1.2 cm at fundus.   Endometrium   Thickness: 6 mm.  No endometrial fluid or mass   Right ovary   Measurements: 3.7 x 2.0 x 3.0 cm = volume: 11.6 mL. Normal morphology without  mass   Left ovary   Measurements: 2.1 x 1.4 x 1.2 cm = volume: 1.8 mL. Normal morphology without mass   Other findings   No free pelvic fluid or adnexal masses.   IMPRESSION: Multiple uterine leiomyomata, largest 4.4 cm diameter likely extending submucosal at posterior upper uterus.   Remainder of exam unremarkable.     Electronically Signed   By: Ulyses Southward M.D.   On: 08/05/2022 12:40   Assessment Uterine leiomyoma, unspecified location  Menorrhagia with regular cycle   Plan She was given information on  Sonata for treatment of fibroids and will f/u with Dr. Donavan Foil  20 min face to face with review of results and images of [place], counseling  Scheryl Darter 08/25/2022, 11:41 AM

## 2022-08-25 NOTE — Progress Notes (Signed)
42 y.o. GYN presents for Follow up US results.  C/o having a period 2 times this Month. 5/2-9/24 and 08/18/22.

## 2022-09-02 ENCOUNTER — Other Ambulatory Visit (HOSPITAL_BASED_OUTPATIENT_CLINIC_OR_DEPARTMENT_OTHER): Payer: Self-pay

## 2022-09-02 DIAGNOSIS — E876 Hypokalemia: Secondary | ICD-10-CM

## 2022-09-05 ENCOUNTER — Ambulatory Visit (INDEPENDENT_AMBULATORY_CARE_PROVIDER_SITE_OTHER): Payer: Medicaid Other | Admitting: Obstetrics and Gynecology

## 2022-09-05 VITALS — BP 108/67 | HR 91 | Wt 131.0 lb

## 2022-09-05 DIAGNOSIS — D251 Intramural leiomyoma of uterus: Secondary | ICD-10-CM

## 2022-09-05 DIAGNOSIS — N920 Excessive and frequent menstruation with regular cycle: Secondary | ICD-10-CM

## 2022-09-05 DIAGNOSIS — D250 Submucous leiomyoma of uterus: Secondary | ICD-10-CM | POA: Diagnosis not present

## 2022-09-05 NOTE — Progress Notes (Signed)
  CC: Sonata consultation Subjective:    Patient ID: Katie Garrett, female    DOB: 11-Oct-1980, 42 y.o.   MRN: [id number]  HPI 42 yo G2P2, c/s x 2, seen for [facility] consultation.  Reviewed previous ultrasound findings with patient as well as treatment options including Sonata.  Viewed procedure video with patient as well.  Pt desires Sonata for treatment   Review of Systems     Objective:   Physical Exam Vitals:   09/05/22 1553  BP: 108/67  Pulse: 91   CLINICAL DATA:  Uterine fibroids, LMP 07/16/2022   EXAM: TRANSABDOMINAL AND TRANSVAGINAL ULTRASOUND OF PELVIS   TECHNIQUE: Both transabdominal and transvaginal ultrasound examinations of the pelvis were performed. Transabdominal technique was performed for global imaging of the pelvis including uterus, ovaries, adnexal regions, and pelvic cul-de-sac. It was necessary to proceed with endovaginal exam following the transabdominal exam to visualize the endometrium and ovaries.   COMPARISON:  03/29/2020   FINDINGS: Uterus   Measurements: 10.9 x 4.8 x 6.7 cm = volume: 183 mL. Anteverted. Heterogeneous myometrium. Nabothian cyst at cervix. Several masses are present consistent with leiomyomata. Largest of these is 4.4 cm diameter posterior LEFT upper uterus, likely extends submucosal. Smaller leiomyomata 2.7 cm posteriorly and 1.2 cm at fundus.   Endometrium   Thickness: 6 mm.  No endometrial fluid or mass   Right ovary   Measurements: 3.7 x 2.0 x 3.0 cm = volume: 11.6 mL. Normal morphology without mass   Left ovary   Measurements: 2.1 x 1.4 x 1.2 cm = volume: 1.8 mL. Normal morphology without mass   Other findings   No free pelvic fluid or adnexal masses.   IMPRESSION: Multiple uterine leiomyomata, largest 4.4 cm diameter likely extending submucosal at posterior upper uterus.   Remainder of exam unremarkable.        Assessment & Plan:   1. Intramural and submucous leiomyoma of uterus Treatment  options for symptomatic uterine fibroids. Myfembree UFE Sonata Hysterectomy.   Pt is adamant she does not want hysterectomy at this time and would like a lesser invasive option.  Pt advised the procedure is not guaranteed to stop or greatly improve all uterine bleeding, but has a decent chance to do so.  Pt desires Sonata and insurance form filled out today.  Will schedule for Sonata and possible hysteroscopy with myosure   2. Menorrhagia with regular cycle Also discussed use of lysteda for treatment of menorrhagia. Pt declined.  I spent 20 minutes dedicated to the care of this patient including previsit review of records, face to face time with the patient discussing treatment options and post visit testing.   Warden Fillers, MD Faculty Attending, Center for [facility]

## 2022-10-21 ENCOUNTER — Emergency Department (HOSPITAL_BASED_OUTPATIENT_CLINIC_OR_DEPARTMENT_OTHER)
Admission: EM | Admit: 2022-10-21 | Discharge: 2022-10-21 | Disposition: A | Discharge status: Home / Self Care | Payer: Commercial Managed Care - PPO | Attending: Emergency Medicine | Admitting: Emergency Medicine

## 2022-10-21 ENCOUNTER — Other Ambulatory Visit: Payer: Self-pay

## 2022-10-21 ENCOUNTER — Encounter (HOSPITAL_BASED_OUTPATIENT_CLINIC_OR_DEPARTMENT_OTHER): Payer: Self-pay | Admitting: Emergency Medicine

## 2022-10-21 DIAGNOSIS — R1310 Dysphagia, unspecified: Secondary | ICD-10-CM | POA: Diagnosis present

## 2022-10-21 DIAGNOSIS — D649 Anemia, unspecified: Secondary | ICD-10-CM | POA: Diagnosis not present

## 2022-10-21 DIAGNOSIS — E041 Nontoxic single thyroid nodule: Secondary | ICD-10-CM | POA: Insufficient documentation

## 2022-10-21 DIAGNOSIS — E876 Hypokalemia: Secondary | ICD-10-CM

## 2022-10-21 HISTORY — DX: Benign neoplasm of connective and other soft tissue, unspecified: D21.9

## 2022-10-21 LAB — CBC WITH DIFFERENTIAL/PLATELET
Abs Immature Granulocytes: 0.01 10*3/uL (ref 0.00–0.07)
Basophils Absolute: 0 10*3/uL (ref 0.0–0.1)
Basophils Relative: 1 %
Eosinophils Absolute: 0.2 10*3/uL (ref 0.0–0.5)
Eosinophils Relative: 3 %
HCT: 33.7 % — ABNORMAL LOW (ref 36.0–46.0)
Hemoglobin: 10.5 g/dL — ABNORMAL LOW (ref 12.0–15.0)
Immature Granulocytes: 0 %
Lymphocytes Relative: 28 %
Lymphs Abs: 1.9 10*3/uL (ref 0.7–4.0)
MCH: 24.9 pg — ABNORMAL LOW (ref 26.0–34.0)
MCHC: 31.2 g/dL (ref 30.0–36.0)
MCV: 80 fL (ref 80.0–100.0)
Monocytes Absolute: 0.3 10*3/uL (ref 0.1–1.0)
Monocytes Relative: 5 %
Neutro Abs: 4.3 10*3/uL (ref 1.7–7.7)
Neutrophils Relative %: 63 %
Platelets: 351 10*3/uL (ref 150–400)
RBC: 4.21 MIL/uL (ref 3.87–5.11)
RDW: 17.8 % — ABNORMAL HIGH (ref 11.5–15.5)
WBC: 6.8 10*3/uL (ref 4.0–10.5)
nRBC: 0 % (ref 0.0–0.2)

## 2022-10-21 LAB — BASIC METABOLIC PANEL
Anion gap: 6 (ref 5–15)
BUN: 11 mg/dL (ref 6–20)
CO2: 27 mmol/L (ref 22–32)
Calcium: 9.1 mg/dL (ref 8.9–10.3)
Chloride: 107 mmol/L (ref 98–111)
Creatinine, Ser: 0.67 mg/dL (ref 0.44–1.00)
GFR, Estimated: >60 mL/min (ref >60)
Glucose, Bld: 102 mg/dL — ABNORMAL HIGH (ref 70–99)
Potassium: 3.1 mmol/L — ABNORMAL LOW (ref 3.5–5.1)
Sodium: 140 mmol/L (ref 135–145)

## 2022-10-21 LAB — TSH: TSH: 0.602 u[IU]/mL (ref 0.350–4.500)

## 2022-10-21 MED ORDER — POTASSIUM CHLORIDE 20 MEQ PO PACK
40.0000 meq | PACK | Freq: Once | ORAL | Status: AC
  Administered 2022-10-21: 40 meq via ORAL

## 2022-10-21 MED ORDER — FAMOTIDINE 20 MG PO TABS: 20.0000 mg | ORAL_TABLET | Freq: Every day | ORAL | 1 refills | Status: AC

## 2022-10-21 MED ORDER — POTASSIUM CHLORIDE CRYS ER 20 MEQ PO TBCR
20.0000 meq | EXTENDED_RELEASE_TABLET | Freq: Every day | ORAL | 0 refills | Status: DC
Start: 2022-10-21 — End: 2023-01-20

## 2022-10-21 MED ORDER — POTASSIUM CHLORIDE CRYS ER 20 MEQ PO TBCR: 40.0000 meq | EXTENDED_RELEASE_TABLET | Freq: Once | ORAL | Status: DC

## 2022-10-21 MED ORDER — FAMOTIDINE 20 MG PO TABS: 20.0000 mg | ORAL_TABLET | Freq: Every day | ORAL | Status: DC

## 2022-10-21 NOTE — Discharge Instructions (Addendum)
You came to the ED for difficulty swallowing/throat pain. You were noted to have an abnormal thyroid, on a CT scan in the past. You have an abnormal thyroid with a nodule also noted on it. This needs to be followed up.   We recommend you follow up with your PCP, for thyroid evaluation  You were also noted to have low potassium, and treated with some oral potasium. We are sending  you home with 10 days of potassium. Talk to your PCP about restarting potasium and re-checking your potassium.   It also sounds like you are having some symptoms of GERD or Acid Reflux. We recommend you try over the counter medicine called Pepcid. Take this daily, and follow up with your PCP about your reflux

## 2022-10-21 NOTE — ED Notes (Signed)
Reviewed AVS/discharge instruction with patient. Time allotted for and all questions answered. Patient is agreeable for d/c and escorted to ed exit by staff.  

## 2022-10-21 NOTE — ED Triage Notes (Signed)
Pt presents to ED POV. Pt c/o dysphagia. Reports that her thyroid swells up intermittently.

## 2022-10-21 NOTE — ED Provider Notes (Addendum)
[place] EMERGENCY DEPARTMENT AT [facility] Provider Note   CSN: [id number] Arrival date & time: 10/21/22  1238     History  Chief Complaint  Patient presents with   Dysphagia    Katie Garrett is a 42 y.o. female.  Patient notes that she developed difficulties swallowing, last night. Patient was having difficulty swallowing solids and liquids/feeling like things were getting stuck in throat. Patient note's this has happened before, about 2 months ago, when her thyroid had gotten swollen. She was seen in the ED for it, but worked up for different problems and never heard anything more about her thyroid. She reports the swelling and difficulties swallowing went away on their own. She denies any breathing difficulties, fever, nausea, vomiting, or malaise, and is in normal state of health. 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 Patient also appreciates that she's been losing weight unintentionally since 2021, and lost about 30 to 40 pounds already this year. Patient also notes that her HR is often fast, even at rest, and gets even higher when she get's up from seated. Patient also reports a hx of GERD and that her symptoms were notable about 2 wks ago, for 3-4 days of intense heart burn.   The history is provided by the patient.       Home Medications Prior to Admission medications   Medication Sig Start Date End Date Taking? Authorizing Provider  amLODipine (NORVASC) 10 MG tablet Take 1 tablet (10 mg total) by mouth daily. 08/16/22   Alyson Reedy, FNP  ferrous sulfate 325 (65 FE) MG tablet Take 1 tablet (325 mg total) by mouth every other day. 08/16/22   Alyson Reedy, FNP  medroxyPROGESTERone (PROVERA) 10 MG tablet Take 1 tablet (10 mg total) by mouth daily. 07/28/22   Adam Phenix, MD  potassium chloride SA (KLOR-CON M) 20 MEQ tablet Take 1 tablet (20 mEq total) by mouth 2 (two) times daily. 08/16/22    Alyson Reedy, FNP      Allergies    Onion    Review of Systems   Review of Systems  Constitutional:  Negative for fever.  Gastrointestinal:  Negative for diarrhea, rectal pain and vomiting.  Musculoskeletal:  Negative for neck pain and neck stiffness.    Physical Exam Updated Vital Signs BP 115/75 (BP Location: Right Arm)   Pulse 71   Temp 98 F (36.7 C) (Oral)   Resp 16   SpO2 100%  Physical Exam Constitutional:      Appearance: Normal appearance. She is normal weight. She is not ill-appearing.  Neck:     Thyroid: Thyromegaly (left sided) present. No thyroid mass or thyroid tenderness.     Trachea: Trachea normal.  Musculoskeletal:     Cervical back: Normal range of motion. No edema, erythema, rigidity or tenderness. No muscular tenderness. Normal range of motion.  Lymphadenopathy:     Cervical: No cervical adenopathy.  Neurological:     Mental Status: She is alert.     ED Results / Procedures / Treatments   Labs (all labs ordered are listed, but only abnormal results are displayed) Labs Reviewed  CBC WITH DIFFERENTIAL/PLATELET - Abnormal; Notable for the following components:      Result Value   Hemoglobin 10.5 (*)    HCT 33.7 (*)    MCH 24.9 (*)    RDW 17.8 (*)    All other components within normal limits  TSH  BASIC  METABOLIC PANEL  T3    EKG None  Radiology No results found.  Procedures Procedures    Medications Ordered in ED Medications - No data to display  ED Course/ Medical Decision Making/ A&P                             Medical Decision Making Patient comes in for concern of dysphagia. Patient has had similar problem in the past, but it went away on it's own and doctors didn't seem to work it up last time. Through chart review, patient noted to have this issue in April, but was sent to ED for concerning low anemia. Patient thyroid slightly enlarged on left, despite normal TSH. Patient noted to have CT neck showing abnormal thyroid  w/ nodule. This has not been followed up, but will need to be followed w/ PCP. Patient symptoms concerning for GERD given recent reflux history, and sound more consistent with odynophagia causing some dysphagia. Will recommend OTC pepcid and f/u w/ PCP.  Patient noted to have low K, that was repleted during visit. Patient sent home with 10 day course of K supplementation to be taken daily, and plan for f/u w/ provider for K recheck and management.   Patient stable, with hx of thyroid problems, that need to be followed up with PCP, as there is no urgent/emergent concern. Patient okay to d/c home and follow up with PCP.   Amount and/or Complexity of Data Reviewed Labs: ordered.  Risk OTC drugs. Prescription drug management.           Final Clinical Impression(s) / ED Diagnoses Final diagnoses:  None    Rx / DC Orders ED Discharge Orders     None         Bess Kinds, MD 10/21/22 1556    Bess Kinds, MD 10/21/22 1600    Blane Ohara, MD 10/21/22 2257

## 2022-10-22 LAB — T3: T3, Total: 112 ng/dL (ref 71–180)

## 2022-10-24 ENCOUNTER — Other Ambulatory Visit (HOSPITAL_BASED_OUTPATIENT_CLINIC_OR_DEPARTMENT_OTHER): Payer: Self-pay | Admitting: Family Medicine

## 2022-10-24 DIAGNOSIS — E041 Nontoxic single thyroid nodule: Secondary | ICD-10-CM

## 2022-10-25 ENCOUNTER — Other Ambulatory Visit (HOSPITAL_BASED_OUTPATIENT_CLINIC_OR_DEPARTMENT_OTHER): Payer: Self-pay

## 2022-10-25 ENCOUNTER — Telehealth (HOSPITAL_BASED_OUTPATIENT_CLINIC_OR_DEPARTMENT_OTHER): Payer: Self-pay | Admitting: Family Medicine

## 2022-10-25 DIAGNOSIS — E041 Nontoxic single thyroid nodule: Secondary | ICD-10-CM

## 2022-10-25 NOTE — Telephone Encounter (Signed)
Spoke with Brandie, patient needs a thyroid US before biopsy can be scheduled. [place] has been ordered

## 2022-10-25 NOTE — Telephone Encounter (Signed)
Katie Garrett -[facility] 08/30/1980 (order fine biopsy -needs more information please call [phone] opt 1 then opt 5

## 2022-11-03 ENCOUNTER — Ambulatory Visit
Admission: RE | Admit: 2022-11-03 | Discharge: 2022-11-03 | Disposition: A | Discharge status: Home / Self Care | Payer: Commercial Managed Care - PPO | Source: Ambulatory Visit | Attending: Family Medicine | Admitting: Family Medicine

## 2022-11-03 DIAGNOSIS — E041 Nontoxic single thyroid nodule: Secondary | ICD-10-CM

## 2022-11-04 ENCOUNTER — Other Ambulatory Visit: Payer: Self-pay | Admitting: Family Medicine

## 2022-11-04 ENCOUNTER — Other Ambulatory Visit (HOSPITAL_BASED_OUTPATIENT_CLINIC_OR_DEPARTMENT_OTHER): Payer: Self-pay | Admitting: Family Medicine

## 2022-11-04 DIAGNOSIS — E049 Nontoxic goiter, unspecified: Secondary | ICD-10-CM

## 2022-11-04 DIAGNOSIS — E041 Nontoxic single thyroid nodule: Secondary | ICD-10-CM

## 2022-11-04 DIAGNOSIS — E042 Nontoxic multinodular goiter: Secondary | ICD-10-CM

## 2022-11-10 ENCOUNTER — Other Ambulatory Visit (HOSPITAL_BASED_OUTPATIENT_CLINIC_OR_DEPARTMENT_OTHER): Payer: Medicaid Other

## 2022-11-16 ENCOUNTER — Ambulatory Visit (HOSPITAL_BASED_OUTPATIENT_CLINIC_OR_DEPARTMENT_OTHER): Payer: Medicaid Other | Admitting: Family Medicine

## 2022-11-18 ENCOUNTER — Encounter (HOSPITAL_BASED_OUTPATIENT_CLINIC_OR_DEPARTMENT_OTHER): Payer: Self-pay | Admitting: Family Medicine

## 2022-11-18 ENCOUNTER — Ambulatory Visit (INDEPENDENT_AMBULATORY_CARE_PROVIDER_SITE_OTHER): Payer: Commercial Managed Care - PPO | Admitting: Family Medicine

## 2022-11-18 VITALS — BP 105/70 | HR 68 | Ht 61.0 in | Wt 124.5 lb

## 2022-11-18 DIAGNOSIS — R634 Abnormal weight loss: Secondary | ICD-10-CM

## 2022-11-18 DIAGNOSIS — I10 Essential (primary) hypertension: Secondary | ICD-10-CM | POA: Diagnosis not present

## 2022-11-18 DIAGNOSIS — E042 Nontoxic multinodular goiter: Secondary | ICD-10-CM

## 2022-11-18 NOTE — Progress Notes (Signed)
Established Patient Office Visit  Subjective   Patient ID: Katie Garrett, female    DOB: June 21, 1980  Age: 42 y.o. MRN: [id number]  Chief Complaint  Patient presents with   Hypertension    Pt is here following up for BP. Has concerns with her weight as she is losing weight fast.   HYPERTENSION: Rima L Tisdale-Johnson is a 42 year-old female patient who presents for the medical management of hypertension.  Patient's current hypertension medication regimen is: amlodipine 10mg  daily  Patient is currently taking prescribed medications for HTN.  Patient is regularly keeping a check on BP at home. Home readings: 104/60- reports nothing high  Adhering to low sodium diet: yes Exercising Regularly:  Denies headache, dizziness, CP, SHOB, vision changes.    Losing weight-last visit 131lbs & today 124lb Reports she has a normal appetite- sometimes it varies  BP Readings from Last 3 Encounters:  11/18/22 105/70  10/21/22 115/75  09/05/22 108/67    Review of Systems  Constitutional:  Positive for weight loss. Negative for malaise/fatigue.  Respiratory:  Negative for cough and shortness of breath.   Cardiovascular:  Positive for palpitations. Negative for chest pain and leg swelling.  Gastrointestinal:  Negative for abdominal pain, nausea and vomiting.  Musculoskeletal:  Negative for myalgias.  Neurological:  Negative for dizziness, tremors, weakness and headaches.  Psychiatric/Behavioral:  The patient is not nervous/anxious and does not have insomnia.       Objective:     BP 105/70 (BP Location: Left Arm, Patient Position: Sitting, Cuff Size: Normal)   Pulse 68   Ht 5\' 1"  (1.549 m)   Wt 124 lb 8 oz (56.5 kg)   LMP 10/24/2022 (Approximate)   SpO2 100%   BMI 23.52 kg/m  BP Readings from Last 3 Encounters:  11/18/22 105/70  10/21/22 115/75  09/05/22 108/67     Physical Exam Constitutional:      Appearance: Normal appearance.  Cardiovascular:     Rate and  Rhythm: Normal rate and regular rhythm.     Pulses: Normal pulses.     Heart sounds: Normal heart sounds.  Pulmonary:     Effort: Pulmonary effort is normal.     Breath sounds: Normal breath sounds.  Neurological:     Mental Status: She is alert.  Psychiatric:        Mood and Affect: Mood normal.        Behavior: Behavior normal.        Thought Content: Thought content normal.        Judgment: Judgment normal.       Assessment & Plan:   1. Primary hypertension Patient presents today with well controlled blood pressure. Patient in no acute distress and is well-appearing. Denies chest pain, shortness of breath, lower extremity edema, vision changes, headaches. Cardiovascular exam with heart regular rate and rhythm. Normal heart sounds, no murmurs present. No lower extremity edema present. Lungs clear to auscultation bilaterally. Patient is currently taking amlodipine 10mg  daily. No refills needed. Advised patient to closely monitor blood pressure at home. Will continue with current medication regimen.   2. Multinodular goiter Scheduled for FNA biopsy on 12/28/2022 for multinodular goiters noted on incidental CT scan of neck on 07/16/2022 and thyroid US on 11/03/2022.  - TSH Rfx on Abnormal to Free T4  3. Weight loss Patient reports she is noticing a decrease in her weight. Plan to reassess thyroid function. Think this is related to her thyroid nodules. Counseled patient about eating small,  frequent meals that are high in protein. Will reach out to endocrinology to see if she can get established for further management. Urgent referral placed.  - TSH Rfx on Abnormal to Free T4   Return in about 2 months (around 01/18/2023) for thyroid nodules/weight loss.    Alyson Reedy, FNP

## 2022-11-19 LAB — TSH RFX ON ABNORMAL TO FREE T4: TSH: 1.15 u[IU]/mL (ref 0.450–4.500)

## 2022-11-21 ENCOUNTER — Encounter (HOSPITAL_BASED_OUTPATIENT_CLINIC_OR_DEPARTMENT_OTHER): Payer: Self-pay

## 2022-12-02 ENCOUNTER — Other Ambulatory Visit: Payer: Self-pay | Admitting: Obstetrics & Gynecology

## 2022-12-02 DIAGNOSIS — N920 Excessive and frequent menstruation with regular cycle: Secondary | ICD-10-CM

## 2022-12-02 DIAGNOSIS — D259 Leiomyoma of uterus, unspecified: Secondary | ICD-10-CM

## 2022-12-02 IMAGING — US US PELVIS COMPLETE TRANSABD/TRANSVAG W DUPLEX
1 series · 13 of 25 positions shown · non-contrast
Comparison: CT abdomen and pelvis 03/29/2020

CLINICAL DATA: BILATERAL pelvic pain since [REDACTED], pelvic mass,
abnormal CT; LMP 03/23/2020

EXAM:
TRANSABDOMINAL AND TRANSVAGINAL ULTRASOUND OF PELVIS
DOPPLER ULTRASOUND OF OVARIES
TECHNIQUE: Both transabdominal and transvaginal ultrasound examinations of the
pelvis were performed. Transabdominal technique was performed for
global imaging of the pelvis including uterus, ovaries, adnexal
regions, and pelvic cul-de-sac.
It was necessary to proceed with endovaginal exam following the
transabdominal exam to visualize the endometrium and ovaries. Color
and duplex Doppler ultrasound was utilized to evaluate blood flow to
the ovaries.

[Series 1: us pelvis complete transabd/transvag w duplex · 13 of 115 slices shown]
[im 1/115]
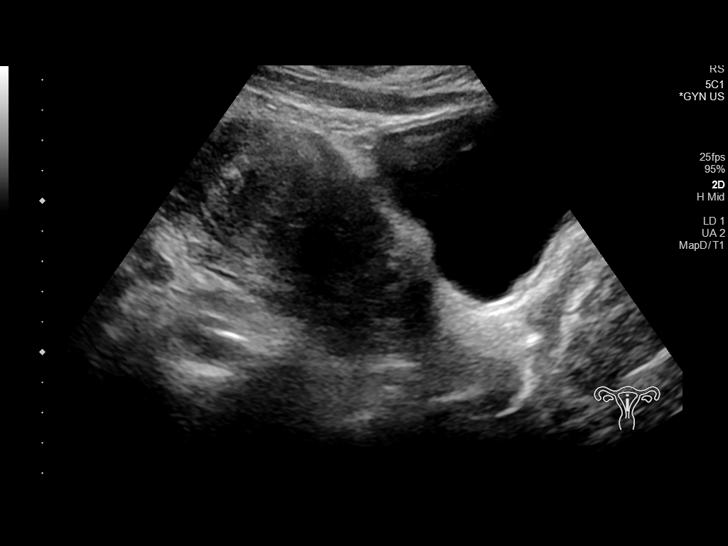
[im 10/115]
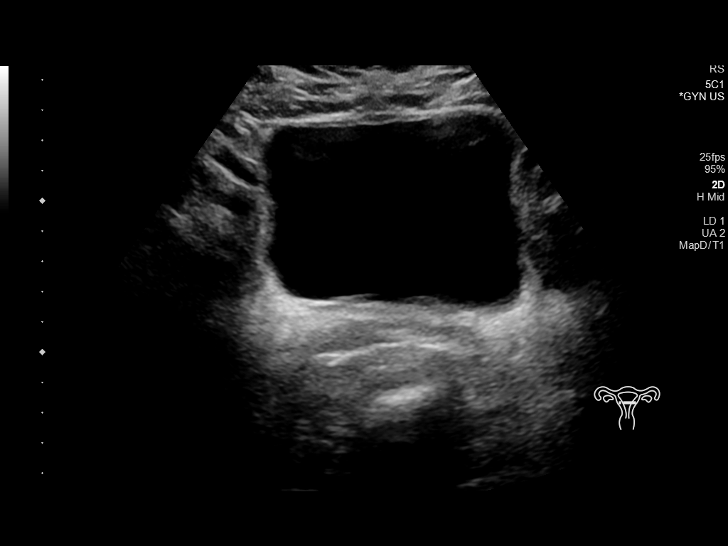
[im 20/115]
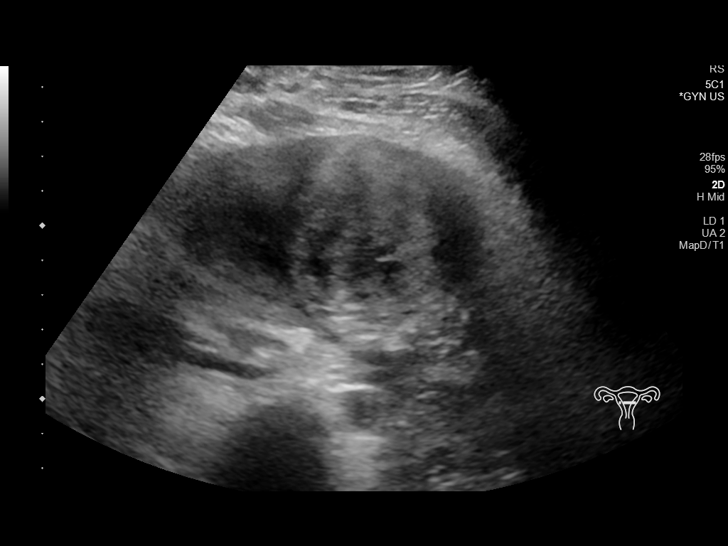
[im 29/115]
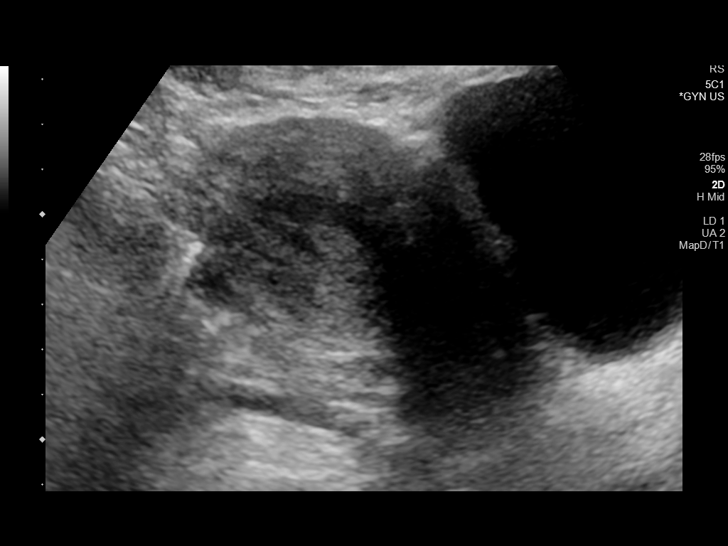
[im 39/115]
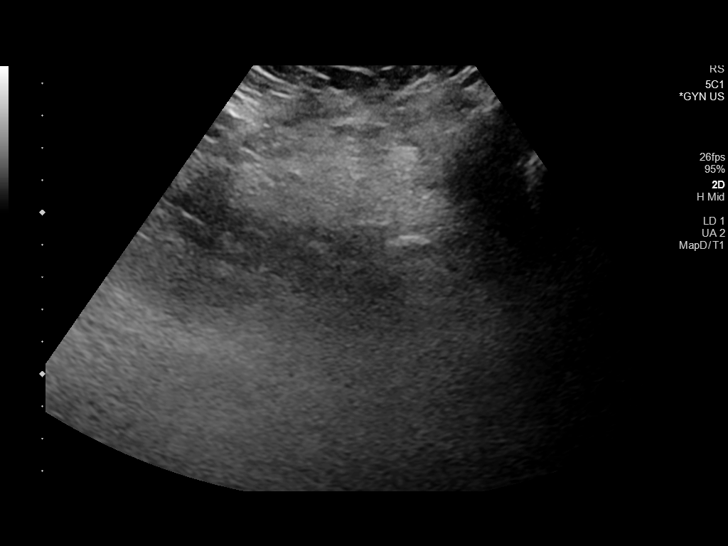
[im 48/115]
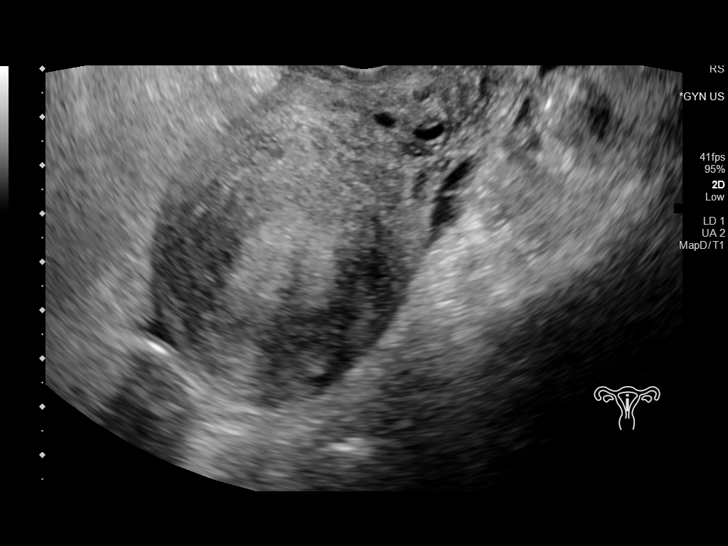
[im 58/115]
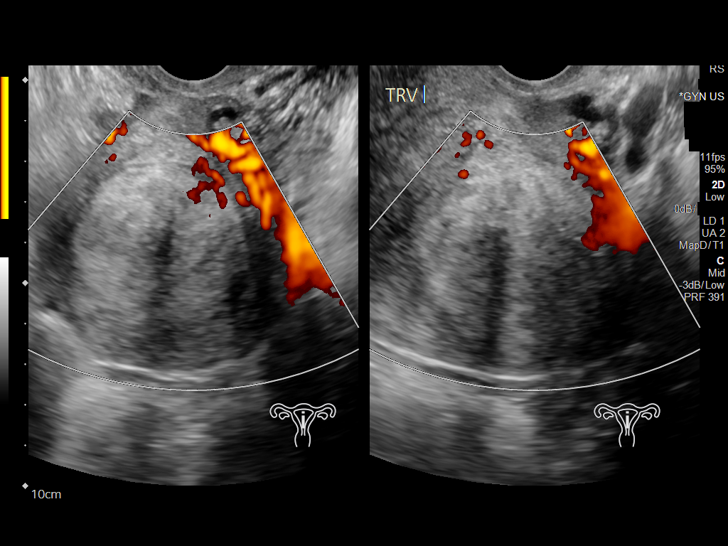
[im 67/115]
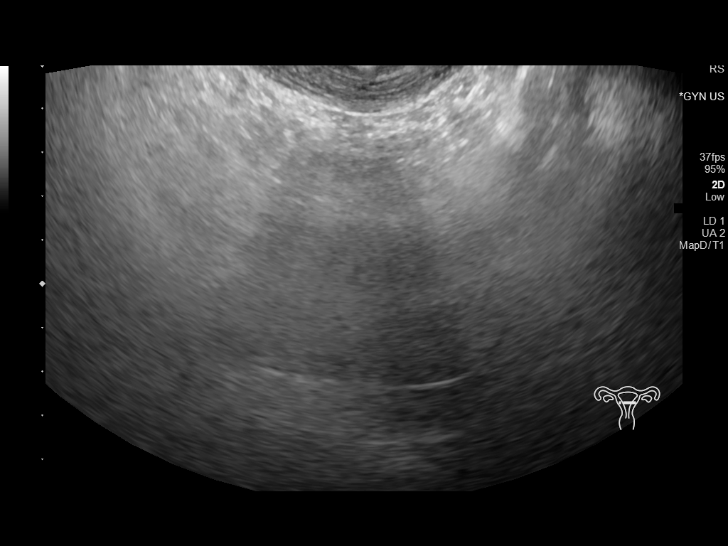
[im 77/115]
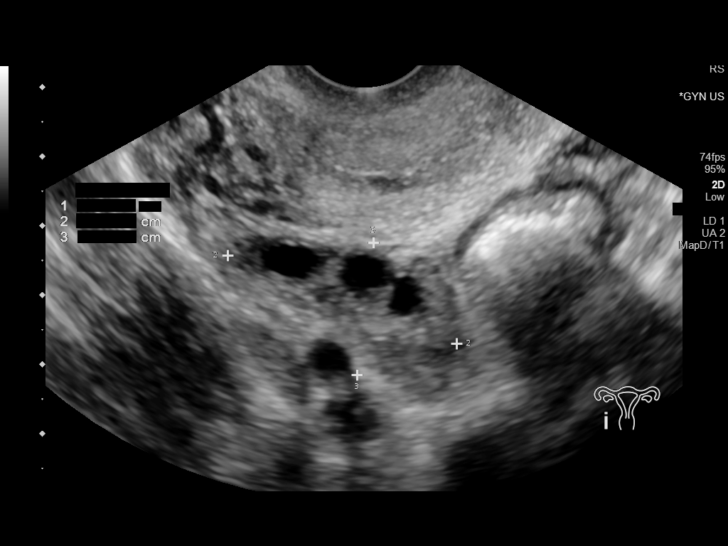
[im 86/115]
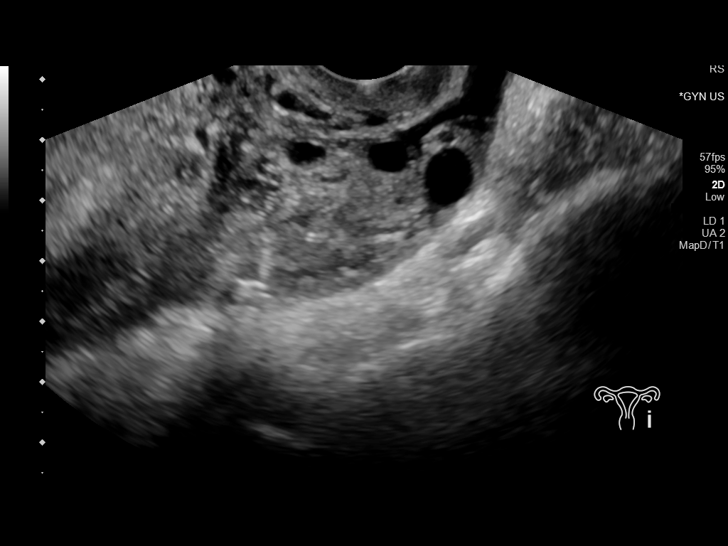
[im 96/115]
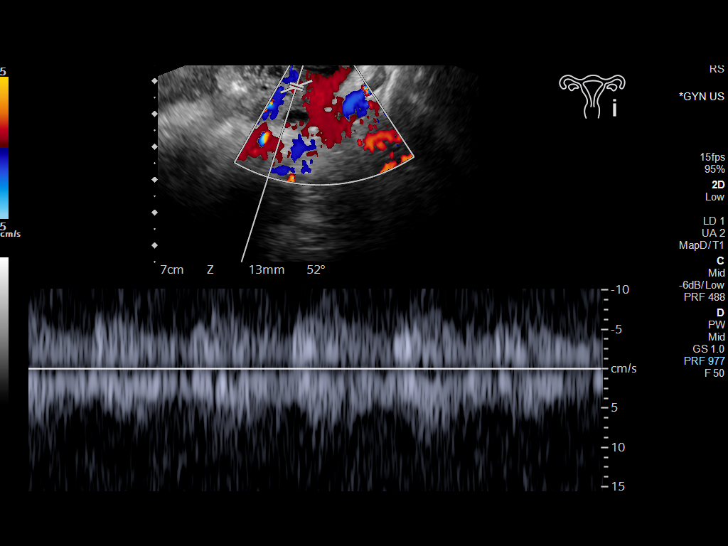
[im 105/115]
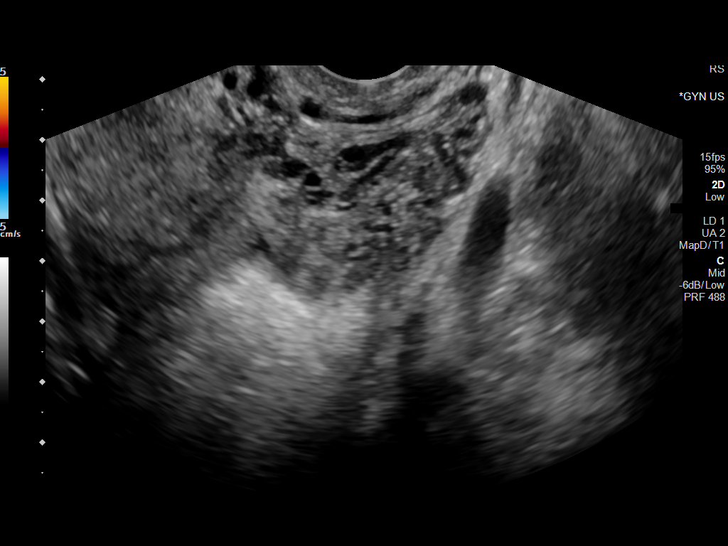
[im 115/115]
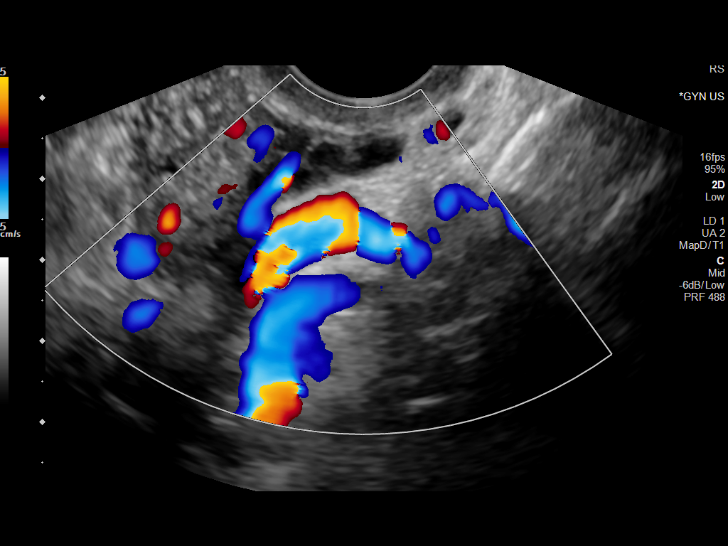

[13 of 25 positions shown; findings below may reference images not displayed]

FINDINGS: Uterus

Measurements: 12.3 x 5.4 x 9.1 LEFT fundal subserosal leiomyoma
x 4.9 x 5.9 cm and an anterior RIGHT subserosal leiomyoma 2.6 x
x 2.8 cm. The LEFT fundal leiomyoma 5.9 cm diameter corresponds to
mass identified on CT.

Endometrium

Thickness: 17 mm.  No endometrial fluid or focal abnormality

Right ovary

Measurements: 3.5 x 1.9 x 1.5 cm = volume: 5.5 mL. Normal morphology
without mass. Internal blood flow present on color Doppler imaging.

Left ovary

Measurements: 4.1 x 2.9 x 2.8 cm = volume: 17.3 mL. Normal
morphology without mass. Internal blood flow present on color
Doppler imaging.

Pulsed Doppler evaluation of both ovaries demonstrates normal
low-resistance arterial and venous waveforms.

Other findings

Trace free pelvic fluid.  No adnexal masses.
IMPRESSION: Two uterine leiomyomata, largest of which is 5.9 cm diameter at the
LEFT fundus and corresponds to the CT finding.

Otherwise normal appearing uterus and ovaries.

No sonographic evidence of ovarian mass or torsion.

## 2022-12-02 IMAGING — CT CT ABD-PELV W/ CM
2 of 4 series · 17 of 46 positions shown, 19 images · IV contrast (OMNIPAQUE 300)
Comparison: 02/16/2014

CLINICAL DATA: Left lower quadrant pain

EXAM:
CT ABDOMEN AND PELVIS WITH CONTRAST
TECHNIQUE: Multidetector CT imaging of the abdomen and pelvis was performed
using the standard protocol following bolus administration of
intravenous contrast.
CONTRAST:  100mL OMNIPAQUE IOHEXOL 300 MG/ML  SOLN

[Series 2: axial st · axial · 0.71mm/px · z∈[-299,+81]mm · 14 of 86 slices shown, 16 images]
[im 5/86  soft-tissue]
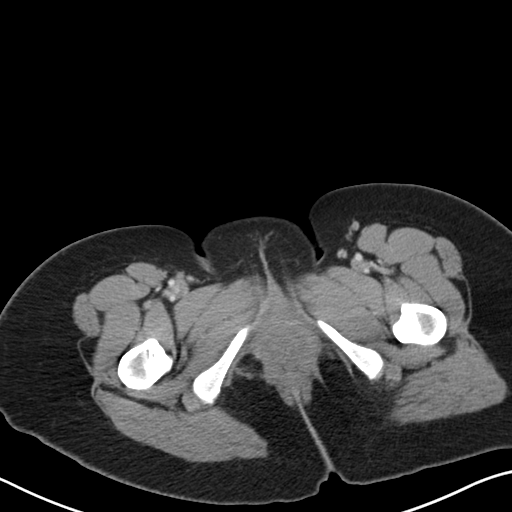
[im 5/86  bone]
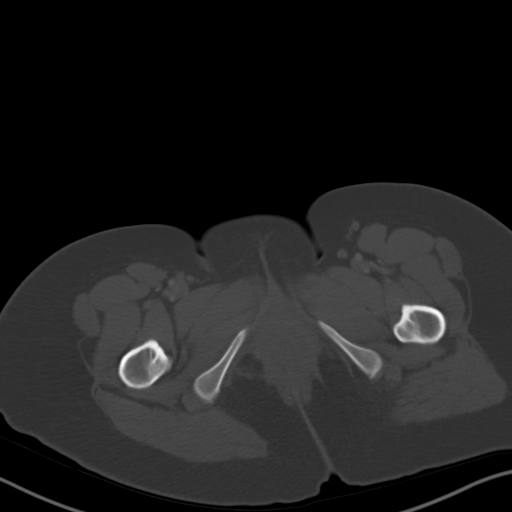
[im 10/86  soft-tissue]
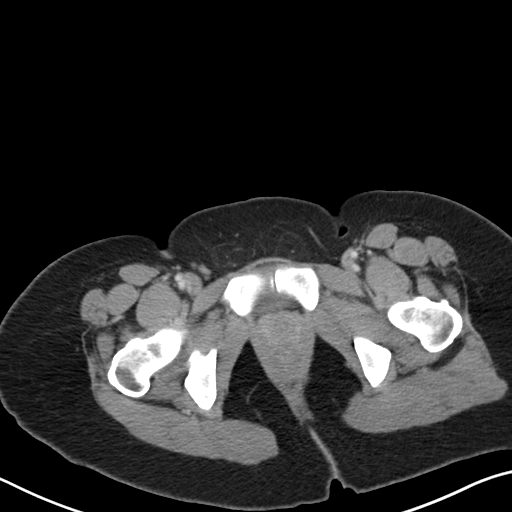
[im 19/86  soft-tissue]
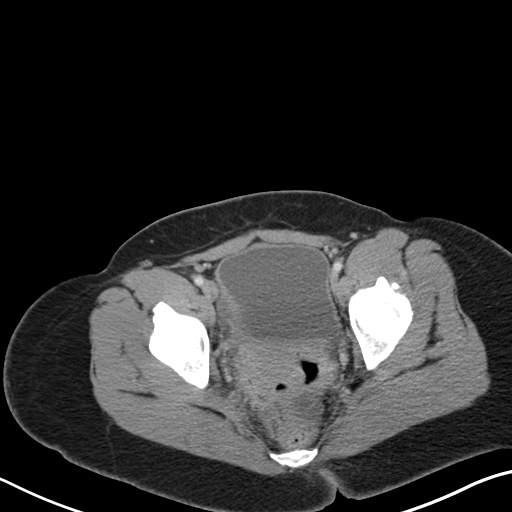
[im 24/86  soft-tissue]
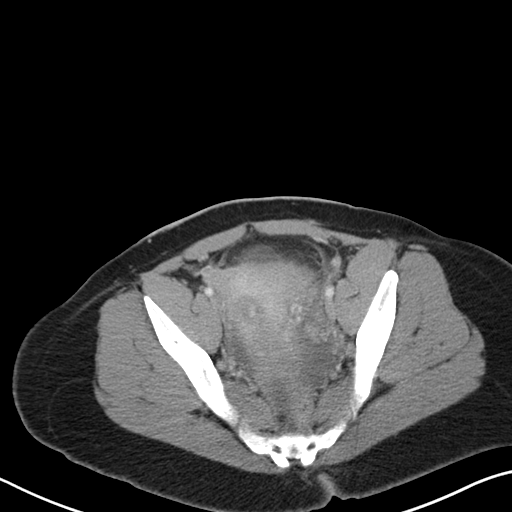
[im 29/86  soft-tissue]
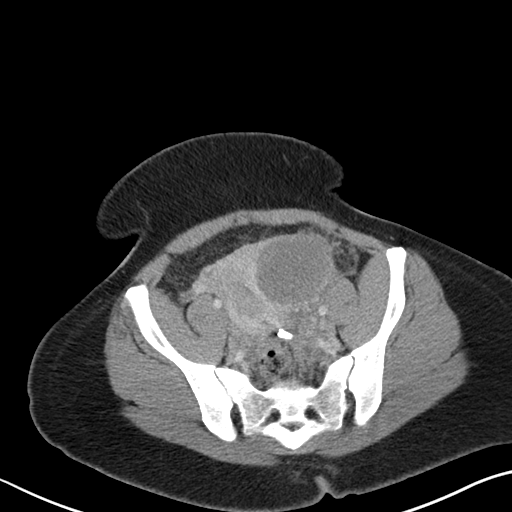
[im 34/86  soft-tissue]
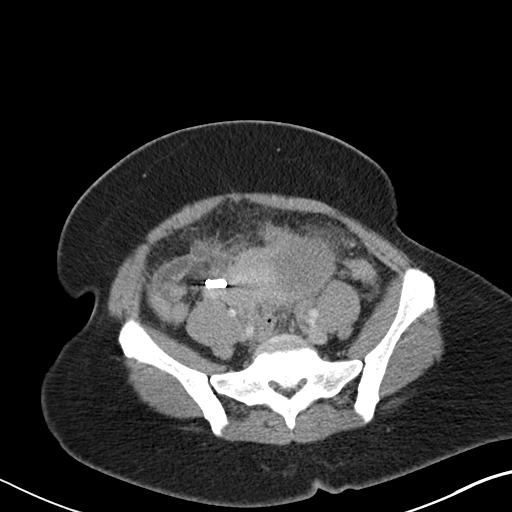
[im 38/86  soft-tissue]
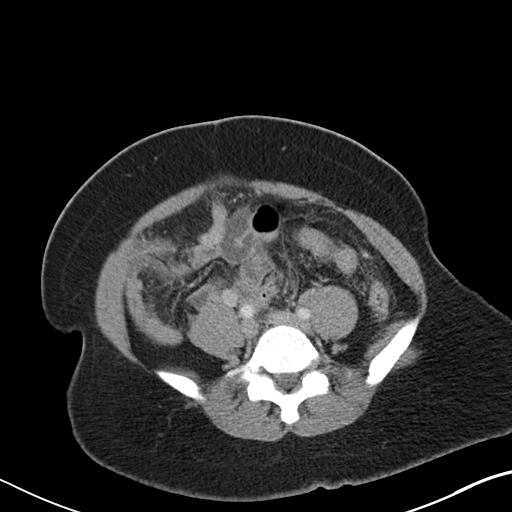
[im 48/86  soft-tissue]
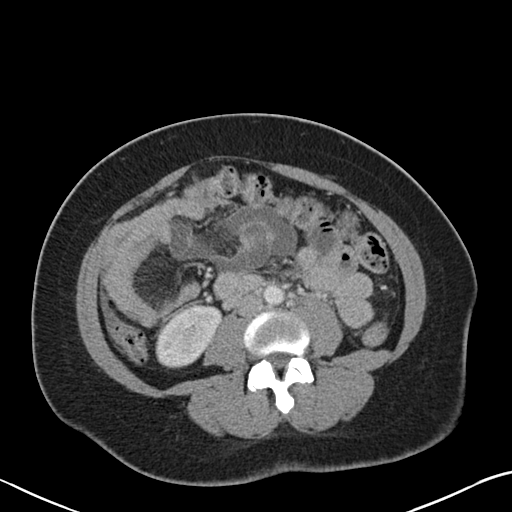
[im 52/86  soft-tissue]
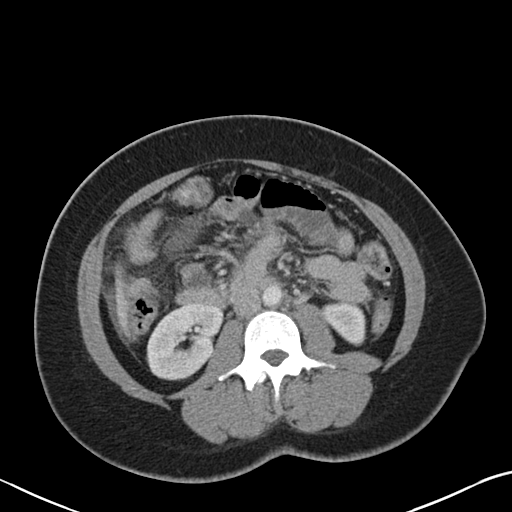
[im 52/86  bone]
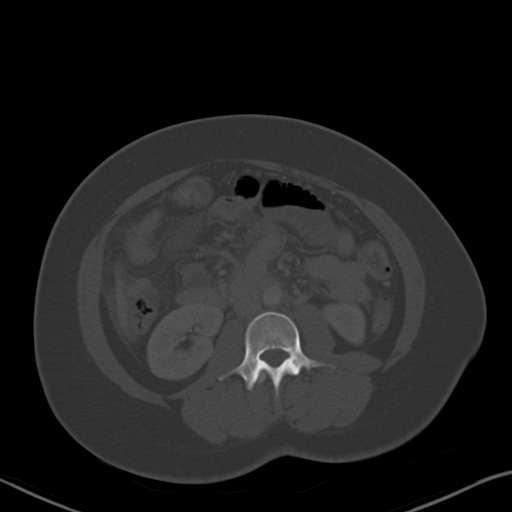
[im 57/86  soft-tissue]
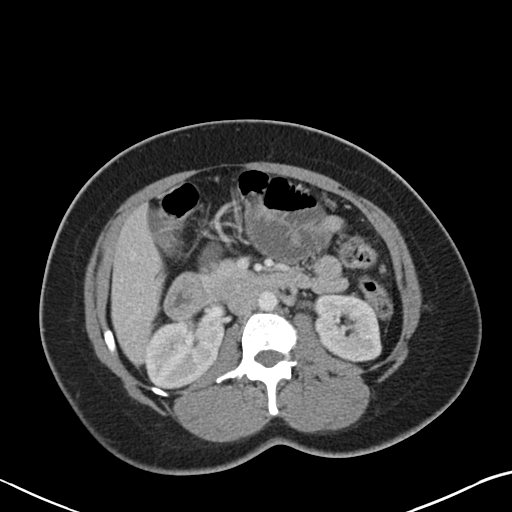
[im 62/86  soft-tissue]
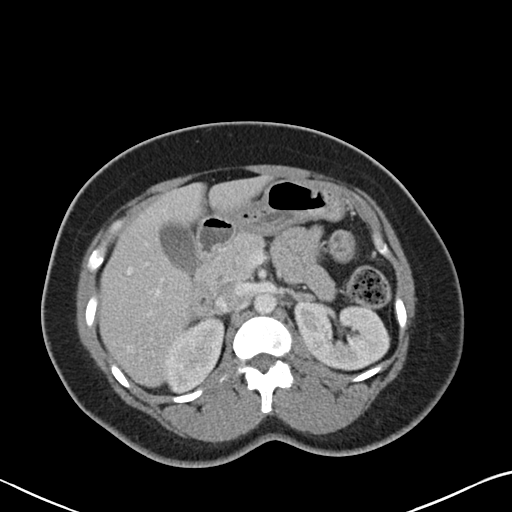
[im 67/86  soft-tissue]
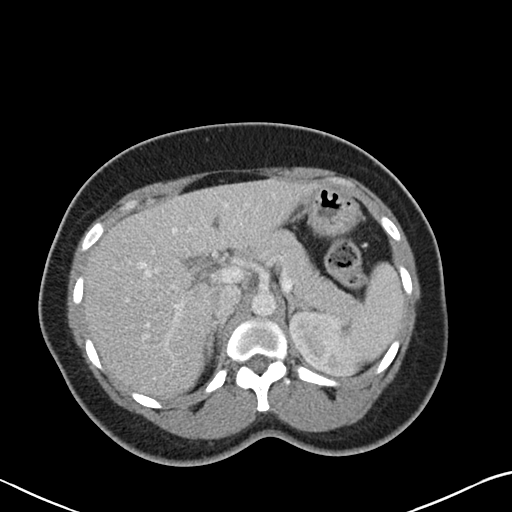
[im 76/86  soft-tissue]
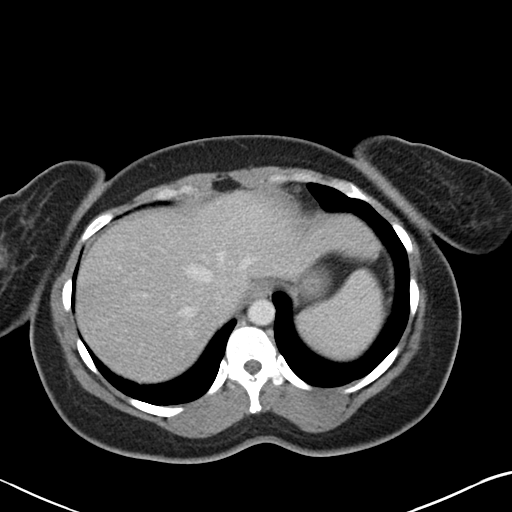
[im 81/86  soft-tissue]
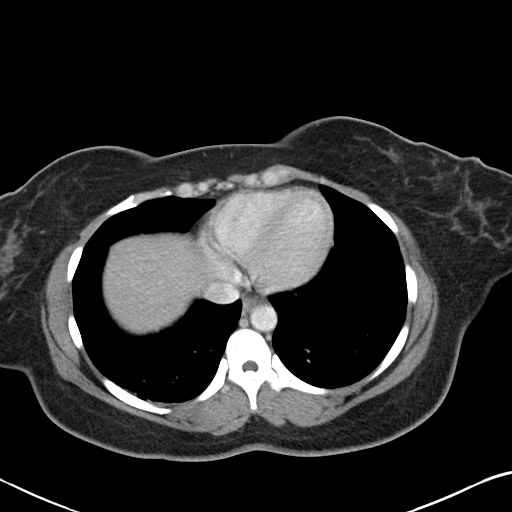

[Series 4: coronal st · coronal · 0.82mm/px · 3 of 145 slices shown]
[im 49/145  soft-tissue]
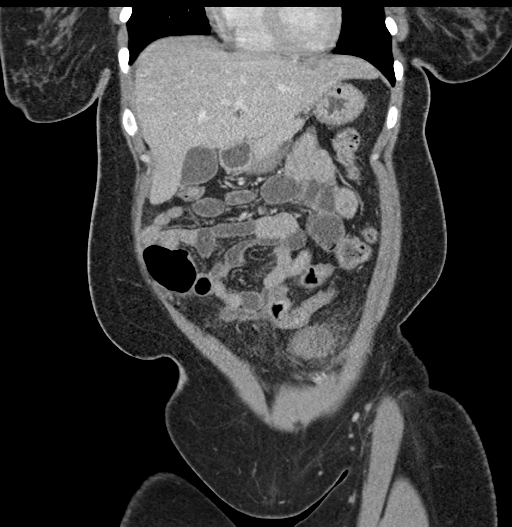
[im 65/145  soft-tissue]
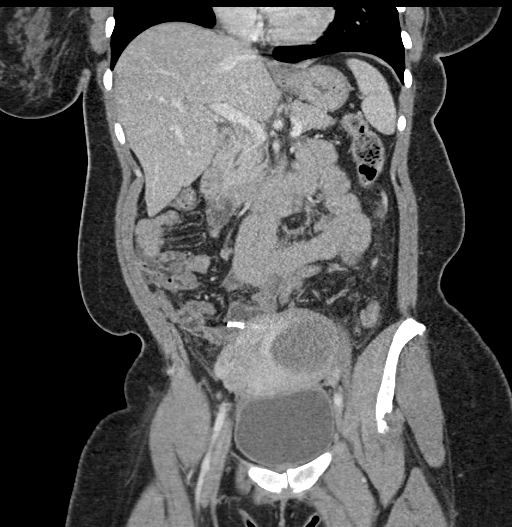
[im 81/145  soft-tissue]
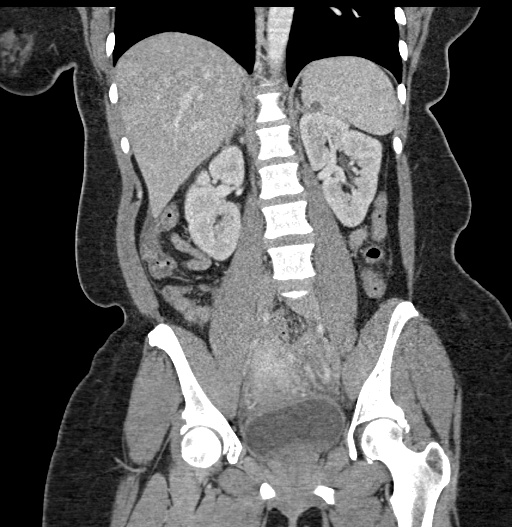

[17 of 46 positions shown; findings below may reference images not displayed]

FINDINGS: Lower chest: No acute abnormality.

Hepatobiliary: No focal liver abnormality is seen. No gallstones,
gallbladder wall thickening, or biliary dilatation.

Pancreas: Unremarkable. No pancreatic ductal dilatation or
surrounding inflammatory changes.

Spleen: Normal in size.  0.9 cm splenic cyst.

Adrenals/Urinary Tract: Adrenal glands are unremarkable. Kidneys are
normal, without renal calculi, focal lesion, or hydronephrosis.
Bladder is unremarkable.

Stomach/Bowel: Stomach is decompressed. Small bowel is nondilated.
Normal appendix. The colon is nondilated with a few distal
descending and proximal sigmoid diverticula.

Vascular/Lymphatic: No significant vascular findings are present. No
enlarged abdominal or pelvic lymph nodes.

Reproductive: Tubal ligation clips. 6 cm left adnexal relatively
homogeneous low-attenuation process.

Other: Small amount of pelvic and right pericolic gutter ascites. No
free air.

Musculoskeletal: No acute or significant osseous findings.
IMPRESSION: 1. 6 cm left adnexal low-attenuation process, with small amount of
pelvic and right pericolic gutter ascites. Primary considerations
include tubo-ovarian abscess versus hemorrhagic cyst. Because this
lesion is not adequately characterized, prompt US is recommended for
further evaluation.
2. Descending and proximal sigmoid diverticulosis.

## 2022-12-19 ENCOUNTER — Other Ambulatory Visit (HOSPITAL_BASED_OUTPATIENT_CLINIC_OR_DEPARTMENT_OTHER): Payer: Self-pay | Admitting: Family Medicine

## 2022-12-19 DIAGNOSIS — N920 Excessive and frequent menstruation with regular cycle: Secondary | ICD-10-CM

## 2022-12-19 MED ORDER — FERROUS SULFATE 325 (65 FE) MG PO TABS: 325.0000 mg | ORAL_TABLET | ORAL | 0 refills | Status: AC

## 2022-12-28 ENCOUNTER — Ambulatory Visit
Admission: RE | Admit: 2022-12-28 | Discharge: 2022-12-28 | Disposition: A | Discharge status: Home / Self Care | Payer: Commercial Managed Care - PPO | Source: Ambulatory Visit | Attending: Family Medicine

## 2022-12-28 ENCOUNTER — Other Ambulatory Visit (HOSPITAL_COMMUNITY)
Admission: RE | Admit: 2022-12-28 | Discharge: 2022-12-28 | Disposition: A | Discharge status: Home / Self Care | Payer: Commercial Managed Care - PPO | Source: Ambulatory Visit | Attending: Family Medicine | Admitting: Family Medicine

## 2022-12-28 DIAGNOSIS — E041 Nontoxic single thyroid nodule: Secondary | ICD-10-CM | POA: Diagnosis present

## 2022-12-29 LAB — CYTOLOGY - NON PAP

## 2023-01-11 ENCOUNTER — Telehealth: Payer: Self-pay

## 2023-01-11 NOTE — Telephone Encounter (Signed)
Called patient to schedule surgery w/ Dr. Donavan Foil. Patient chose to have procedure on 01/31/23 @1  pm. Provided pre-op instruction and surgery details over the phone.

## 2023-01-18 ENCOUNTER — Encounter (HOSPITAL_BASED_OUTPATIENT_CLINIC_OR_DEPARTMENT_OTHER): Payer: Self-pay | Admitting: Family Medicine

## 2023-01-18 ENCOUNTER — Ambulatory Visit (INDEPENDENT_AMBULATORY_CARE_PROVIDER_SITE_OTHER): Payer: Commercial Managed Care - PPO | Admitting: Family Medicine

## 2023-01-18 VITALS — BP 112/80 | HR 81 | Ht 61.0 in | Wt 123.9 lb

## 2023-01-18 DIAGNOSIS — R002 Palpitations: Secondary | ICD-10-CM | POA: Insufficient documentation

## 2023-01-18 DIAGNOSIS — R634 Abnormal weight loss: Secondary | ICD-10-CM | POA: Insufficient documentation

## 2023-01-18 DIAGNOSIS — E876 Hypokalemia: Secondary | ICD-10-CM | POA: Diagnosis not present

## 2023-01-18 NOTE — Progress Notes (Signed)
Established Patient Office Visit  Subjective   Patient ID: Katie Garrett, female    DOB: 18-Jul-1980  Age: 42 y.o. MRN: [id number]  Chief Complaint  Patient presents with   Medical Management of Chronic Issues    40-month follow up;  patient denies any real concerns. Wants to have recent test results discussed.   Ms. Katie Garrett is a 42 year old female patient who presents today for follow-up regarding her thyroid function. She reports she continues to lose weight and is stll experiencing heart palpitations. She recently had her thyroid biopsy completed on 12/28/2022 and results are benign. She reports she is going to opt for surgical ablation versus surgical resection. She has an appt with endocrinology next week.   Review of Systems  Constitutional:  Positive for weight loss (unintentional). Negative for malaise/fatigue.  Eyes:  Negative for blurred vision and double vision.  Respiratory:  Negative for cough and shortness of breath.   Cardiovascular:  Positive for palpitations. Negative for chest pain, orthopnea, claudication and leg swelling.  Gastrointestinal:  Negative for abdominal pain, nausea and vomiting.  Musculoskeletal:  Negative for myalgias.  Neurological:  Negative for dizziness, weakness and headaches.  Psychiatric/Behavioral:  Negative for depression and suicidal ideas. The patient is not nervous/anxious and does not have insomnia.    Physical Exam Vitals reviewed.  Constitutional:      Appearance: Normal appearance.  Cardiovascular:     Rate and Rhythm: Normal rate and regular rhythm.     Pulses: Normal pulses.     Heart sounds: Normal heart sounds.  Pulmonary:     Effort: Pulmonary effort is normal.     Breath sounds: Normal breath sounds.  Neurological:     Mental Status: She is alert.  Psychiatric:        Mood and Affect: Mood normal.        Behavior: Behavior normal.       Objective:     BP 112/80 (BP Location: Right Arm, Patient Position:  Sitting, Cuff Size: Normal)   Pulse 81   Ht 5\' 1"  (1.549 m)   Wt 123 lb 14.4 oz (56.2 kg)   LMP 12/27/2022 (Approximate)   SpO2 100%   BMI 23.41 kg/m  BP Readings from Last 3 Encounters:  01/18/23 112/80  11/18/22 105/70  10/21/22 115/75      Assessment & Plan:  Heart palpitations Assessment & Plan: Patient is a well-appearing 42 year old female patient who presents today with concerns for heart palpitations. Patient is unsure how high her heart rate increases. Review of vitals with well-controlled blood pressure and normal HR. Patient in no acute distress. Denies chest pain, shortness of breath, lower extremity edema, vision changes, headaches, lightheadedness. Cardiovascular exam with heart regular rate and rhythm. Normal heart sounds, no murmurs present. No lower extremity edema present. Lungs clear to auscultation bilaterally. Provided patient with reassurance that her heart rate is normal and no irregularities sounds heard during auscultation. Discussed use of beta blocker if patient would want to add this to her current medication regimen. Patient declines at this time. Will check fr electrolyte abnormalities today and update patient with results.    Unintentional weight loss Assessment & Plan: Patient reports that she continues to lose weight. Recent thyroid function within normal limits- less likely related to thyroid function. Weight in May 2024 at initial visit with weight of 131lbs. Weight today 123lbs. Patient has an endocrinology appt next week and advised her to discuss with specialist.    Hypokalemia Assessment & Plan:  Review of previous BMPs with decreased potassium in the past. Will repeat BMP today.   Orders: -     Basic metabolic panel     Return if symptoms worsen or fail to improve.    Alyson Reedy, FNP

## 2023-01-18 NOTE — Assessment & Plan Note (Signed)
Review of previous BMPs with decreased potassium in the past. Will repeat BMP today.

## 2023-01-18 NOTE — Assessment & Plan Note (Signed)
Patient is a well-appearing 42 year old female patient who presents today with concerns for heart palpitations. Patient is unsure how high her heart rate increases. Review of vitals with well-controlled blood pressure and normal HR. Patient in no acute distress. Denies chest pain, shortness of breath, lower extremity edema, vision changes, headaches, lightheadedness. Cardiovascular exam with heart regular rate and rhythm. Normal heart sounds, no murmurs present. No lower extremity edema present. Lungs clear to auscultation bilaterally. Provided patient with reassurance that her heart rate is normal and no irregularities sounds heard during auscultation. Discussed use of beta blocker if patient would want to add this to her current medication regimen. Patient declines at this time. Will check fr electrolyte abnormalities today and update patient with results.

## 2023-01-18 NOTE — Assessment & Plan Note (Addendum)
Patient reports that she continues to lose weight. Recent thyroid function within normal limits- less likely related to thyroid function. Weight in May 2024 at initial visit with weight of 131lbs. Weight today 123lbs. Patient has an endocrinology appt next week and advised her to discuss with specialist.

## 2023-01-19 LAB — BASIC METABOLIC PANEL
BUN/Creatinine Ratio: 18 (ref 9–23)
BUN: 14 mg/dL (ref 6–24)
CO2: 22 mmol/L (ref 20–29)
Calcium: 9.5 mg/dL (ref 8.7–10.2)
Chloride: 104 mmol/L (ref 96–106)
Creatinine, Ser: 0.79 mg/dL (ref 0.57–1.00)
Glucose: 86 mg/dL (ref 70–99)
Potassium: 3.3 mmol/L — ABNORMAL LOW (ref 3.5–5.2)
Sodium: 141 mmol/L (ref 134–144)
eGFR: 96 mL/min/{1.73_m2} (ref >59)

## 2023-01-20 ENCOUNTER — Other Ambulatory Visit (HOSPITAL_BASED_OUTPATIENT_CLINIC_OR_DEPARTMENT_OTHER): Payer: Self-pay | Admitting: Family Medicine

## 2023-01-20 DIAGNOSIS — E876 Hypokalemia: Secondary | ICD-10-CM

## 2023-01-20 MED ORDER — POTASSIUM CHLORIDE CRYS ER 10 MEQ PO TBCR
10.0000 meq | EXTENDED_RELEASE_TABLET | Freq: Every day | ORAL | 1 refills | Status: DC
Start: 2023-01-20 — End: 2023-01-31

## 2023-01-26 ENCOUNTER — Ambulatory Visit: Payer: Commercial Managed Care - PPO | Admitting: Endocrinology

## 2023-01-26 ENCOUNTER — Encounter: Payer: Self-pay | Admitting: Endocrinology

## 2023-01-26 VITALS — BP 90/60 | HR 70 | Resp 20 | Ht 61.0 in | Wt 124.4 lb

## 2023-01-26 DIAGNOSIS — E041 Nontoxic single thyroid nodule: Secondary | ICD-10-CM

## 2023-01-26 DIAGNOSIS — E042 Nontoxic multinodular goiter: Secondary | ICD-10-CM | POA: Diagnosis not present

## 2023-01-26 NOTE — Patient Instructions (Signed)
US thyroid in 6 months.

## 2023-01-26 NOTE — Progress Notes (Signed)
Outpatient Endocrinology Note [place] Mada Sadik, MD  01/26/23  Patient's Name: Katie Garrett    DOB: 1981-04-03    MRN: [id number]  REASON OF VISIT: New consult for thyroid nodules   PCP: Alyson Reedy, FNP  REFERRING PROVIDER: Alyson Reedy, FNP  HISTORY OF PRESENT ILLNESS:   Katie Garrett is a 42 y.o. old female with past medical history as listed below is presented for  evaluation of thyroid nodules.  Pertinent Thyroid History:  - Patient has palpable thyroid nodules/goiter and had ultrasound thyroid in November 03, 2022 showed multinodular goiter with left mid thyroid nodule measuring 4 .3 x 3.5 x 2.6 cm solid isoechoic and several scattered cystic nodules throughout the right lobe largest measuring 1.2 cm, she underwent FNA biopsy of left dominant thyroid nodule measuring 4.8 cm on December 28, 2022 cytology consistent with benign follicular nodule. -CT neck in April 2024 showed enlarged thyroid heterogenous including dominant anterior left thyroid measuring 3.6 cm. -Patient reports gradual weight loss.  She also complains of palpitation.  Denies heat intolerance.  No dysphagia.  Occasional neck discomfort.  She reports intermittent enlargement of thyroid gland.  No change in voice.  No choking. -Patient has uterine fibroid, following with OB/GYN and has heavy bleeding leading to anemia.  Patient reports she is planned to have surgery for uterine fibroids in the end of this month. -No family history of thyroid cancer and no radiation exposure to head and neck.  - Thyroid US on 11/03/2022 : reviewed images showed  CLINICAL DATA:  Palpable abnormality. Thyromegaly with thyroid nodules.   EXAM: THYROID ULTRASOUND   TECHNIQUE: Ultrasound examination of the thyroid gland and adjacent soft tissues was performed.   COMPARISON:  None Available.   FINDINGS: Parenchymal Echotexture: Mildly heterogenous   Isthmus: Normal in size measuring 0.3 cm in diameter    Right lobe: Normal in size measuring 4.7 x 2.6 x 2.0 cm   Left lobe: Borderline enlarged measuring 5.2 x 4.4 x 2.6 cm   _________________________________________________________   Estimated total number of nodules >/= 1 cm: 2   Number of spongiform nodules >/=  2 cm not described below (TR1): 0   Number of mixed cystic and solid nodules >/= 1.5 cm not described below (TR2): 0   _________________________________________________________   There are several scattered anechoic and colloid (benign) containing cysts scattered throughout the right lobe of the thyroid, with dominant right-sided thyroid cysts measuring 0.7 cm (labeled 1) and 1.2 cm (labeled 2) and none of which meet imaging criteria to recommend percutaneous sampling or continued dedicated follow-up.   _________________________________________________________   Nodule # 3:   Location: Left; Mid   Maximum size: 4.3 cm; Other 2 dimensions: 3.5 x 2.6 cm   Composition: solid/almost completely solid (2)   Echogenicity: isoechoic (1)   Shape: not taller-than-wide (0)   Margins: ill-defined (0)   Echogenic foci: none (0)   ACR TI-RADS total points: 3.   ACR TI-RADS risk category: TR3 (3 points).   ACR TI-RADS recommendations:   **Given size (>/= 2.5 cm) and appearance, fine needle aspiration of this mildly suspicious nodule should be considered based on TI-RADS criteria.   _________________________________________________________   IMPRESSION: 1. Borderline thyromegaly with findings suggestive of multinodular goiter. 2. Nodule/pseudonodule labeled #3 meets imaging criteria to recommend percutaneous sampling as indicated. 3. None of the additional discretely measured thyroid cysts meet imaging criteria to recommend percutaneous sampling or continued dedicated follow-up.  - FNA of left thyroid nodule on 12/28/2022:  Clinical History: Location: Left; Mid, Maximum Size: 4.3 cm; Other 2  dimensions: 3.5 x  2.6 cm, solid/almost completely solid (2), Isoechoic  (1), ACR-TI-RADS Total Points 3.  Specimen Submitted:  A. THYROID, LEFT LOBE, FINE NEEDLE ASPIRATION   FINAL MICROSCOPIC DIAGNOSIS:  - Benign follicular nodule (Bethesda category II)   SPECIMEN ADEQUACY:  Satisfactory for evaluation    - Labs: she is euthyroid, not on thyroid medication.   Latest Reference Range & Units 08/16/22 14:26 10/21/22 13:05 11/18/22 11:55  TSH 0.450 - 4.500 uIU/mL 0.829 0.602 1.150  Triiodothyronine (T3) 71 - 180 ng/dL  191     No specialty comments available.  Interval history 01/26/23 Patient presented for evaluation and management  of thyroid nodules.   REVIEW OF SYSTEMS:  As per history of present illness.   PAST MEDICAL HISTORY: Past Medical History:  Diagnosis Date   Chlamydia    Fibroid    Headache(784.0)    Pregnancy induced hypertension    Shortness of breath     PAST SURGICAL HISTORY: Past Surgical History:  Procedure Laterality Date   CESAREAN SECTION     CESAREAN SECTION WITH BILATERAL TUBAL LIGATION Bilateral 09/01/2012   Procedure: CESAREAN SECTION WITH BILATERAL TUBAL LIGATION;  Surgeon: Tilda Burrow, MD;  Location: [facility];  Service: Obstetrics;  Laterality: Bilateral;   TOOTH EXTRACTION     TUBAL LIGATION  2014    ALLERGIES: Allergies  Allergen Reactions   Onion Anaphylaxis and Swelling    FAMILY HISTORY:  Family History  Problem Relation Age of Onset   Hypertension Mother    Cancer Mother    Hypertension Father     SOCIAL HISTORY: Social History   Socioeconomic History   Marital status: Widowed    Spouse name: Not on file   Number of children: Not on file   Years of education: Not on file   Highest education level: Not on file  Occupational History   Not on file  Tobacco Use   Smoking status: Former    Current packs/day: 0.00    Types: Cigarettes    Quit date: 04/01/2012    Years since quitting: 10.8   Smokeless tobacco: Never  Vaping Use    Vaping status: Never Used  Substance and Sexual Activity   Alcohol use: No   Drug use: No   Sexual activity: Yes    Partners: Male    Birth control/protection: Surgical  Other Topics Concern   Not on file  Social History Narrative   Not on file   Social Determinants of Health   Financial Resource Strain: Low Risk  (01/18/2023)   Overall Financial Resource Strain (CARDIA)    Difficulty of Paying Living Expenses: Not very hard  Food Insecurity: No Food Insecurity (01/18/2023)   Hunger Vital Sign    Worried About Running Out of Food in the Last Year: Never true    Ran Out of Food in the Last Year: Never true  Transportation Needs: Unmet Transportation Needs (01/18/2023)   PRAPARE - Transportation    Lack of Transportation (Medical): Yes    Lack of Transportation (Non-Medical): Yes  Physical Activity: Insufficiently Active (01/18/2023)   Exercise Vital Sign    Days of Exercise per Week: 7 days    Minutes of Exercise per Session: 20 min  Stress: No Stress Concern Present (01/18/2023)   [employer] of Occupational Health - Occupational Stress Questionnaire    Feeling of Stress : Not at all  Social Connections: Socially Isolated (01/18/2023)  Social Connection and Isolation Panel [NHANES]    Frequency of Communication with Friends and Family: More than three times a week    Frequency of Social Gatherings with Friends and Family: Never    Attends Religious Services: Never    Database administrator or Organizations: No    Attends Banker Meetings: Never    Marital Status: Widowed    MEDICATIONS:  Current Outpatient Medications  Medication Sig Dispense Refill   amLODipine (NORVASC) 10 MG tablet Take 1 tablet (10 mg total) by mouth daily. 90 tablet 1   aspirin-acetaminophen-caffeine (EXCEDRIN MIGRAINE) 250-250-65 MG tablet Take 2 tablets by mouth every 6 (six) hours as needed for headache.     diphenhydramine-acetaminophen (TYLENOL PM) 25-500 MG TABS tablet  Take 2 tablets by mouth 2 (two) times a week.     famotidine (PEPCID) 20 MG tablet Take 1 tablet (20 mg total) by mouth daily. 30 tablet 1   ferrous sulfate 325 (65 FE) MG tablet Take 1 tablet (325 mg total) by mouth every other day. 90 tablet 0   medroxyPROGESTERone (PROVERA) 10 MG tablet Take 1 tablet (10 mg total) by mouth daily. 30 tablet 2   potassium chloride (KLOR-CON M) 10 MEQ tablet Take 1 tablet (10 mEq total) by mouth daily. 30 tablet 1   No current facility-administered medications for this visit.    PHYSICAL EXAM: Vitals:   01/26/23 1441  BP: 90/60  Pulse: 70  Resp: 20  SpO2: 97%  Weight: 124 lb 6.4 oz (56.4 kg)  Height: 5\' 1"  (1.549 m)   Body mass index is 23.51 kg/m.    General: Well developed, well nourished female in no apparent distress.  HEENT: AT/[place], no external lesions. Hearing intact to the spoken word Eyes: EOMI. No stare, proptosis. Conjunctiva clear and no icterus. Neck: Neck supple with significant thyromegaly 3-4 X normal or no lymphadenopathy and palpable thyroid nodule + Lungs: Clear to auscultation, no wheeze. Respirations not labored Heart: S1S2, Regular in rate and rhythm.  Abdomen: Soft, non tender, non distended Neurologic: Alert, oriented, normal speech, deep tendon biceps reflexes normal,  no gross focal neurological deficit Extremities: No pedal pitting edema, no tremors of outstretched hands Skin: Warm, color good.  Psychiatric: Does not appear depressed or anxious  PERTINENT HISTORIC LABORATORY AND IMAGING STUDIES:  All pertinent laboratory results were reviewed. Please see HPI also for further details.   TSH  Date Value Ref Range Status  11/18/2022 1.150 0.450 - 4.500 uIU/mL Final  10/21/2022 0.602 0.350 - 4.500 uIU/mL Final    Comment:    Performed by a 3rd Generation assay with a functional sensitivity of <=0.01 uIU/mL. Performed at [facility], [address], [place], [place] [address]   08/16/2022 0.829  0.450 - 4.500 uIU/mL Final   T3, Total  Date Value Ref Range Status  10/21/2022 112 71 - 180 ng/dL Final    Comment:    (NOTE) Performed At: [facility] [address] [place], [place] [id number] Jolene Schimke MD WC:[id number]      ASSESSMENT / PLAN  1. Multiple thyroid nodules   2. Left thyroid nodule    - Patient has significant thyromegaly with left thyroid nodule measuring 4.3 cm, s/p FNA biopsy in 12/2022 with benign cytology -Patient complains of continued weight loss.  She has normal thyroid function test.  Discussed that weight loss is probably not related with thyroid disorder.  -Patient denies significant neck compressive symptoms.  Thyroid surgery can be considered  if she develop neck compressive symptoms due to thyromegaly.  Plan: -Will monitor thyroid nodules with serial ultrasound.  Will check ultrasound thyroid in 6 months. -Check thyroid function test in 6 months.  She had normal thyroid function test with normal TSH of 1.150 in August 2024.  Thyroid nodule talk: -Approximately 5% of individuals have palpable thyroid nodules and 30% or more of adults have non-palpable nodules. The prevalence of nodules increases with age, so that perhaps 50% of individuals older than 42 years of age have nodules. Many of these nodules will be well under 1cm in size.  -The incidence of thyroid cancer is low, but rising. The prevalence of thyroid cancer is low compared with the prevalence of thyroid nodule.  -There have been a number of studies that have estimated risk of malignancy in thyroid nodule. The estimated risk varies with size and other characteristics of nodules selected for biopsy, the technique used for the biopsy, the institution and its referral patterns, and the characteristics of the local population. Most studies have estimated malignancy risk in nodules that are palpable or > 1cm on U/S to be in the range of 3-15%.    - In general, options for further  evaluation include: - Follow with serial U/S - Fine needle aspiration biopsy - Thyroidectomy - In general the criteria for FNA biopsy includes:  - All palpable nodules - Generally nodules > 1 and for some nodules up to > 1 to 2 cm, based on other criteria.  - Rarely Nodules > 8-87mm in two or more dimensions with high risk sonographic features, or occuring in patients with high risk historical features. Nodules not meeting the above criteria can generally be followed with serial ultrasounds.   Diagnoses and all orders for this visit:  Multiple thyroid nodules -     US THYROID; Future -     T4, free; Future -     TSH; Future  Left thyroid nodule -     US THYROID; Future    DISPOSITION Follow up in clinic in 6 months suggested.  All questions answered and patient verbalized understanding of the plan.  [place] Markella Dao, MD [facility] Endocrinology [facility] [address] [place], [address] [place], [place] [address] Phone # [phone]  At least part of this note was generated using voice recognition software. Inadvertent word errors may have occurred, which were not recognized during the proofreading process.

## 2023-01-29 ENCOUNTER — Encounter: Payer: Self-pay | Admitting: Obstetrics and Gynecology

## 2023-01-29 ENCOUNTER — Other Ambulatory Visit: Payer: Self-pay | Admitting: Obstetrics and Gynecology

## 2023-01-29 DIAGNOSIS — D250 Submucous leiomyoma of uterus: Secondary | ICD-10-CM

## 2023-01-29 MED ORDER — MISOPROSTOL 100 MCG PO TABS: ORAL_TABLET | ORAL | 0 refills | Status: DC

## 2023-01-29 NOTE — Progress Notes (Signed)
Rx sent for preop cytotec

## 2023-01-30 ENCOUNTER — Encounter (HOSPITAL_COMMUNITY): Payer: Self-pay | Admitting: Obstetrics and Gynecology

## 2023-01-30 NOTE — Progress Notes (Signed)
SDW call  Patient was given pre-op instructions over the phone. Patient verbalized understanding of instructions provided.     PCP - Alyson Reedy, NP Cardiologist -  Pulmonary:    PPM/ICD - denies Device Orders - na Rep Notified - na   Chest x-ray - 07/16/2022 EKG -  DOS, 01/31/2023 Stress Test - ECHO -  Cardiac Cath -   Sleep Study/sleep apnea/CPAP: denies  Non-diabetic  Blood Thinner Instructions: denies Aspirin Instructions:denies   ERAS Protcol - NPO  COVID TEST- na    Anesthesia review: No   Patient denies shortness of breath, fever, cough and chest pain over the phone call  Your procedure is scheduled on Tuesday, January 31, 2023  Report to [facility] Main Entrance "A" at  1030  A.M., then check in with the Admitting office.  Call this number if you have problems the morning of surgery:  [phone]   If you have any questions prior to your surgery date call [phone]: Open Monday-Friday 8am-4pm If you experience any cold or flu symptoms such as cough, fever, chills, shortness of breath, etc. between now and your scheduled surgery, please notify us at the above number     Remember:  Do not eat or drink after midnight the night before your surgery  Take these medicines the morning of surgery with A SIP OF WATER:  Amlodipine  As of today, STOP taking any Aspirin (unless otherwise instructed by your surgeon) Aleve, Naproxen, Ibuprofen, Motrin, Advil, Goody's, BC's, all herbal medications, fish oil, and all vitamins.

## 2023-01-31 ENCOUNTER — Ambulatory Visit (HOSPITAL_BASED_OUTPATIENT_CLINIC_OR_DEPARTMENT_OTHER): Payer: Commercial Managed Care - PPO | Admitting: Certified Registered"

## 2023-01-31 ENCOUNTER — Encounter (HOSPITAL_COMMUNITY): Payer: Self-pay | Admitting: Obstetrics and Gynecology

## 2023-01-31 ENCOUNTER — Encounter (HOSPITAL_COMMUNITY)
Admission: RE | Disposition: A | Discharge status: Home / Self Care | Payer: Self-pay | Source: Home / Self Care | Attending: Obstetrics and Gynecology

## 2023-01-31 ENCOUNTER — Ambulatory Visit (HOSPITAL_COMMUNITY)
Admission: RE | Admit: 2023-01-31 | Discharge: 2023-01-31 | Disposition: A | Discharge status: Home / Self Care | Payer: Commercial Managed Care - PPO | Attending: Obstetrics and Gynecology | Admitting: Obstetrics and Gynecology

## 2023-01-31 ENCOUNTER — Ambulatory Visit (HOSPITAL_COMMUNITY): Payer: Commercial Managed Care - PPO | Admitting: Certified Registered"

## 2023-01-31 ENCOUNTER — Other Ambulatory Visit: Payer: Self-pay

## 2023-01-31 DIAGNOSIS — D259 Leiomyoma of uterus, unspecified: Secondary | ICD-10-CM | POA: Diagnosis present

## 2023-01-31 DIAGNOSIS — Z87891 Personal history of nicotine dependence: Secondary | ICD-10-CM | POA: Diagnosis not present

## 2023-01-31 DIAGNOSIS — D649 Anemia, unspecified: Secondary | ICD-10-CM | POA: Diagnosis not present

## 2023-01-31 DIAGNOSIS — I10 Essential (primary) hypertension: Secondary | ICD-10-CM | POA: Diagnosis not present

## 2023-01-31 DIAGNOSIS — K219 Gastro-esophageal reflux disease without esophagitis: Secondary | ICD-10-CM | POA: Insufficient documentation

## 2023-01-31 DIAGNOSIS — Z5982 Transportation insecurity: Secondary | ICD-10-CM | POA: Insufficient documentation

## 2023-01-31 DIAGNOSIS — N920 Excessive and frequent menstruation with regular cycle: Secondary | ICD-10-CM

## 2023-01-31 DIAGNOSIS — D250 Submucous leiomyoma of uterus: Secondary | ICD-10-CM

## 2023-01-31 DIAGNOSIS — D251 Intramural leiomyoma of uterus: Secondary | ICD-10-CM

## 2023-01-31 DIAGNOSIS — Z8249 Family history of ischemic heart disease and other diseases of the circulatory system: Secondary | ICD-10-CM | POA: Insufficient documentation

## 2023-01-31 DIAGNOSIS — Z79899 Other long term (current) drug therapy: Secondary | ICD-10-CM | POA: Diagnosis not present

## 2023-01-31 HISTORY — PX: DILATATION & CURETTAGE/HYSTEROSCOPY WITH MYOSURE: SHX6511

## 2023-01-31 LAB — CBC
HCT: 30.4 % — ABNORMAL LOW (ref 36.0–46.0)
Hemoglobin: 9.3 g/dL — ABNORMAL LOW (ref 12.0–15.0)
MCH: 23.5 pg — ABNORMAL LOW (ref 26.0–34.0)
MCHC: 30.6 g/dL (ref 30.0–36.0)
MCV: 76.8 fL — ABNORMAL LOW (ref 80.0–100.0)
Platelets: 380 10*3/uL (ref 150–400)
RBC: 3.96 MIL/uL (ref 3.87–5.11)
RDW: 16.3 % — ABNORMAL HIGH (ref 11.5–15.5)
WBC: 7.3 10*3/uL (ref 4.0–10.5)
nRBC: 0 % (ref 0.0–0.2)

## 2023-01-31 LAB — TYPE AND SCREEN
ABO/RH(D): O POS
Antibody Screen: NEGATIVE

## 2023-01-31 LAB — POCT PREGNANCY, URINE: Preg Test, Ur: NEGATIVE

## 2023-01-31 SURGERY — RADIOFREQUENCY ABLATION, LEIOMYOMA, UTERUS, TRANSCERVICAL APPROACH, WITH US GUIDANCE
Anesthesia: General

## 2023-01-31 MED ORDER — KETOROLAC TROMETHAMINE 30 MG/ML IJ SOLN
INTRAMUSCULAR | Status: AC
Start: 2023-01-31 — End: ?
  Filled 2023-01-31: qty 1

## 2023-01-31 MED ORDER — HYDROMORPHONE HCL 1 MG/ML IJ SOLN
0.2500 mg | INTRAMUSCULAR | Status: DC | PRN
  Administered 2023-01-31 (×2): 0.5 mg via INTRAVENOUS

## 2023-01-31 MED ORDER — DEXTROSE-SODIUM CHLORIDE 5-0.45 % IV SOLN
INTRAVENOUS | Status: DC
  Administered 2023-01-31: 400 mL via INTRAVENOUS

## 2023-01-31 MED ORDER — OXYCODONE HCL 5 MG PO TABS: 5.0000 mg | ORAL_TABLET | Freq: Once | ORAL | Status: DC | PRN

## 2023-01-31 MED ORDER — ALBUMIN HUMAN 5 % IV SOLN: INTRAVENOUS | Status: DC | PRN

## 2023-01-31 MED ORDER — LIDOCAINE 2% (20 MG/ML) 5 ML SYRINGE
INTRAMUSCULAR | Status: AC
  Filled 2023-01-31: qty 5

## 2023-01-31 MED ORDER — ACETAMINOPHEN 500 MG PO TABS
1000.0000 mg | ORAL_TABLET | ORAL | Status: AC
  Administered 2023-01-31: 1000 mg via ORAL
  Filled 2023-01-31: qty 2

## 2023-01-31 MED ORDER — PROPOFOL 10 MG/ML IV BOLUS
INTRAVENOUS | Status: DC | PRN
  Administered 2023-01-31: 150 mg via INTRAVENOUS

## 2023-01-31 MED ORDER — MIDAZOLAM HCL 2 MG/2ML IJ SOLN
INTRAMUSCULAR | Status: DC | PRN
  Administered 2023-01-31: 2 mg via INTRAVENOUS

## 2023-01-31 MED ORDER — PROPOFOL 10 MG/ML IV BOLUS
INTRAVENOUS | Status: AC
  Filled 2023-01-31: qty 20

## 2023-01-31 MED ORDER — FENTANYL CITRATE (PF) 250 MCG/5ML IJ SOLN
INTRAMUSCULAR | Status: DC | PRN
  Administered 2023-01-31: 50 ug via INTRAVENOUS

## 2023-01-31 MED ORDER — PHENYLEPHRINE 80 MCG/ML (10ML) SYRINGE FOR IV PUSH (FOR BLOOD PRESSURE SUPPORT)
PREFILLED_SYRINGE | INTRAVENOUS | Status: DC | PRN
  Administered 2023-01-31 (×4): 160 ug via INTRAVENOUS

## 2023-01-31 MED ORDER — SOD CITRATE-CITRIC ACID 500-334 MG/5ML PO SOLN
30.0000 mL | ORAL | Status: AC
  Administered 2023-01-31: 30 mL via ORAL
  Filled 2023-01-31: qty 30

## 2023-01-31 MED ORDER — KETOROLAC TROMETHAMINE 30 MG/ML IJ SOLN
INTRAMUSCULAR | Status: DC | PRN
  Administered 2023-01-31: 30 mg via INTRAVENOUS

## 2023-01-31 MED ORDER — DROPERIDOL 2.5 MG/ML IJ SOLN: 0.6250 mg | Freq: Once | INTRAMUSCULAR | Status: DC | PRN

## 2023-01-31 MED ORDER — LIDOCAINE 2% (20 MG/ML) 5 ML SYRINGE
INTRAMUSCULAR | Status: DC | PRN
  Administered 2023-01-31: 80 mg via INTRAVENOUS

## 2023-01-31 MED ORDER — IBUPROFEN 600 MG PO TABS: 600.0000 mg | ORAL_TABLET | Freq: Four times a day (QID) | ORAL | 2 refills | Status: DC | PRN

## 2023-01-31 MED ORDER — HYDROMORPHONE HCL 1 MG/ML IJ SOLN
INTRAMUSCULAR | Status: AC
  Filled 2023-01-31: qty 1

## 2023-01-31 MED ORDER — ORAL CARE MOUTH RINSE: 15.0000 mL | Freq: Once | OROMUCOSAL | Status: AC

## 2023-01-31 MED ORDER — DEXAMETHASONE SODIUM PHOSPHATE 10 MG/ML IJ SOLN
INTRAMUSCULAR | Status: AC
  Filled 2023-01-31: qty 1

## 2023-01-31 MED ORDER — DEXAMETHASONE SODIUM PHOSPHATE 10 MG/ML IJ SOLN
INTRAMUSCULAR | Status: DC | PRN
  Administered 2023-01-31: 10 mg via INTRAVENOUS

## 2023-01-31 MED ORDER — ONDANSETRON HCL 4 MG/2ML IJ SOLN
INTRAMUSCULAR | Status: AC
  Filled 2023-01-31: qty 2

## 2023-01-31 MED ORDER — EPHEDRINE SULFATE (PRESSORS) 50 MG/ML IJ SOLN
INTRAMUSCULAR | Status: DC | PRN
  Administered 2023-01-31 (×2): 5 mg via INTRAVENOUS

## 2023-01-31 MED ORDER — OXYCODONE HCL 5 MG/5ML PO SOLN: 5.0000 mg | Freq: Once | ORAL | Status: DC | PRN

## 2023-01-31 MED ORDER — MIDAZOLAM HCL 2 MG/2ML IJ SOLN
INTRAMUSCULAR | Status: AC
Start: 2023-01-31 — End: ?
  Filled 2023-01-31: qty 2

## 2023-01-31 MED ORDER — CHLORHEXIDINE GLUCONATE 0.12 % MT SOLN
15.0000 mL | Freq: Once | OROMUCOSAL | Status: AC
Start: 2023-01-31 — End: 2023-01-31
  Administered 2023-01-31: 15 mL via OROMUCOSAL
  Filled 2023-01-31: qty 15

## 2023-01-31 MED ORDER — ONDANSETRON HCL 4 MG/2ML IJ SOLN
INTRAMUSCULAR | Status: DC | PRN
  Administered 2023-01-31: 4 mg via INTRAVENOUS

## 2023-01-31 MED ORDER — CEFAZOLIN SODIUM-DEXTROSE 2-4 GM/100ML-% IV SOLN
2.0000 g | INTRAVENOUS | Status: AC
  Administered 2023-01-31: 2 g via INTRAVENOUS
  Filled 2023-01-31: qty 100

## 2023-01-31 MED ORDER — LIDOCAINE HCL (PF) 1 % IJ SOLN
INTRAMUSCULAR | Status: AC
  Filled 2023-01-31: qty 30

## 2023-01-31 MED ORDER — FENTANYL CITRATE (PF) 250 MCG/5ML IJ SOLN
INTRAMUSCULAR | Status: AC
  Filled 2023-01-31: qty 5

## 2023-01-31 MED ORDER — ONDANSETRON HCL 4 MG/2ML IJ SOLN: 4.0000 mg | Freq: Once | INTRAMUSCULAR | Status: DC | PRN

## 2023-01-31 MED ORDER — POVIDONE-IODINE 10 % EX SWAB
2.0000 | Freq: Once | CUTANEOUS | Status: AC
  Administered 2023-01-31: 2 via TOPICAL

## 2023-01-31 SURGICAL SUPPLY — 22 items
CATH ROBINSON RED A/P 16FR (CATHETERS) ×1 IMPLANT
DEVICE MYOSURE LITE (MISCELLANEOUS) IMPLANT
DEVICE MYOSURE REACH (MISCELLANEOUS) IMPLANT
ELECT DISPERSIVE SONATA (ELECTRODE) ×2 IMPLANT
GAUZE 4X4 16PLY ~~LOC~~+RFID DBL (SPONGE) ×1 IMPLANT
GLOVE SURG ENC MOIS LTX SZ8 (GLOVE) ×1 IMPLANT
GLOVE SURG UNDER POLY LF SZ7 (GLOVE) ×2 IMPLANT
GOWN STRL REUS W/ TWL LRG LVL3 (GOWN DISPOSABLE) ×2 IMPLANT
GOWN STRL REUS W/TWL LRG LVL3 (GOWN DISPOSABLE) ×2
HANDPIECE RFA SONATA (MISCELLANEOUS) ×1 IMPLANT
KIT PROCEDURE FLUENT (KITS) ×1 IMPLANT
KIT TURNOVER KIT B (KITS) ×1 IMPLANT
MYOSURE XL FIBROID (MISCELLANEOUS)
PACK VAGINAL MINOR WOMEN LF (CUSTOM PROCEDURE TRAY) ×1 IMPLANT
PAD OB MATERNITY 4.3X12.25 (PERSONAL CARE ITEMS) ×1 IMPLANT
SEAL ROD LENS SCOPE MYOSURE (ABLATOR) ×1 IMPLANT
SLEEVE SCD COMPRESS KNEE MED (STOCKING) ×1 IMPLANT
SYSTEM TISS REMOVAL MYOSURE XL (MISCELLANEOUS) IMPLANT
TOWEL GREEN STERILE FF (TOWEL DISPOSABLE) ×2 IMPLANT
TRAY FOLEY W/BAG SLVR 14FR (SET/KITS/TRAYS/PACK) ×1 IMPLANT
UNDERPAD 30X36 HEAVY ABSORB (UNDERPADS AND DIAPERS) ×1 IMPLANT
WATER STERILE IRR 1000ML POUR (IV SOLUTION) ×1 IMPLANT

## 2023-01-31 NOTE — Anesthesia Postprocedure Evaluation (Signed)
Anesthesia Post Note  Patient: Katie Garrett  Procedure(s) Performed: Radio Frequency Ablation with Sonata DILATATION & CURETTAGE/HYSTEROSCOPY WITH MYOSURE     Patient location during evaluation: PACU Anesthesia Type: General Level of consciousness: awake and alert and oriented Pain management: pain level controlled Vital Signs Assessment: post-procedure vital signs reviewed and stable Respiratory status: spontaneous breathing, nonlabored ventilation and respiratory function stable Cardiovascular status: blood pressure returned to baseline and stable Postop Assessment: no apparent nausea or vomiting Anesthetic complications: no   No notable events documented.  Last Vitals:  Vitals:   01/31/23 1400 01/31/23 1415  BP: (!) 93/56 111/72  Pulse: 68 84  Resp: 13 14  Temp:    SpO2: 100% 99%    Last Pain:  Vitals:   01/31/23 1355  TempSrc:   PainSc: Asleep                 Danford Tat A.

## 2023-01-31 NOTE — Anesthesia Preprocedure Evaluation (Signed)
Anesthesia Evaluation  Patient identified by MRN, date of birth, ID band Patient awake    Reviewed: Allergy & Precautions, NPO status , Patient's Chart, lab work & pertinent test results  Airway Mallampati: II  TM Distance: >3 FB     Dental no notable dental hx. (+) Teeth Intact, Dental Advisory Given   Pulmonary shortness of breath and with exertion, Patient abstained from smoking., former smoker   Pulmonary exam normal breath sounds clear to auscultation       Cardiovascular hypertension, Pt. on medications Normal cardiovascular exam Rhythm:Regular Rate:Normal     Neuro/Psych  Headaches  negative psych ROS   GI/Hepatic Neg liver ROS,GERD  ,,  Endo/Other  negative endocrine ROS    Renal/GU negative Renal ROS  negative genitourinary   Musculoskeletal negative musculoskeletal ROS (+)    Abdominal   Peds  Hematology  (+) Blood dyscrasia, anemia   Anesthesia Other Findings   Reproductive/Obstetrics Menorrhagia  Fibroid uterus                              Anesthesia Physical Anesthesia Plan  ASA: 2  Anesthesia Plan: General   Post-op Pain Management: Minimal or no pain anticipated   Induction: Intravenous  PONV Risk Score and Plan: 4 or greater and Treatment may vary due to age or medical condition, Midazolam, Ondansetron and Dexamethasone  Airway Management Planned: LMA  Additional Equipment: None  Intra-op Plan:   Post-operative Plan: Extubation in OR  Informed Consent: I have reviewed the patients History and Physical, chart, labs and discussed the procedure including the risks, benefits and alternatives for the proposed anesthesia with the patient or authorized representative who has indicated his/her understanding and acceptance.     Dental advisory given  Plan Discussed with: Anesthesiologist and CRNA  Anesthesia Plan Comments:          Anesthesia Quick  Evaluation

## 2023-01-31 NOTE — Discharge Instructions (Signed)
Pt may return to work on  02/06/23

## 2023-01-31 NOTE — Transfer of Care (Signed)
Immediate Anesthesia Transfer of Care Note  Patient: Katie Garrett  Procedure(s) Performed: Radio Frequency Ablation with Sonata DILATATION & CURETTAGE/HYSTEROSCOPY WITH MYOSURE  Patient Location: PACU  Anesthesia Type:General  Level of Consciousness: sedated  Airway & Oxygen Therapy: Patient Spontanous Breathing and Patient connected to face mask oxygen  Post-op Assessment: Report given to RN and Post -op Vital signs reviewed and stable  Post vital signs: Reviewed and stable  Last Vitals:  Vitals Value Taken Time  BP 90/54 01/31/23 1354  Temp    Pulse 68 01/31/23 1356  Resp 13 01/31/23 1356  SpO2 100 % 01/31/23 1356  Vitals shown include unfiled device data.  Last Pain:  Vitals:   01/31/23 1105  TempSrc:   PainSc: 0-No pain      Patients Stated Pain Goal: 0 (01/31/23 1105)  Complications: No notable events documented.

## 2023-01-31 NOTE — Op Note (Signed)
PREOPERATIVE DIAGNOSIS: Symptomatic uterine fibroids POSTOPERATIVE DIAGNOSIS: The same PROCEDURE: Transcervical Sonata uterine fibroid radiofrequency ablation, SURGEON:  Dr. Mariel Aloe   INDICATIONS: 42 y.o. [id number]  here for scheduled surgery for the aforementioned diagnoses.   Risks of surgery were discussed with the patient including but not limited to: bleeding which may require transfusion; infection which may require antibiotics; injury to uterus or surrounding organs; intrauterine scarring which may impair future fertility; need for additional procedures including laparotomy or laparoscopy; and other postoperative/anesthesia complications. Written informed consent was obtained.    FINDINGS:  A 11 week size uterus.   Fibroid A: 4 cm (cluster of 3 calcified fibroids) Fibroid  B: 2 cm  ANESTHESIA:   General INTRAVENOUS FLUIDS:  250 ml of LR ESTIMATED BLOOD LOSS:  5 ml SPECIMENS: n/a COMPLICATIONS:  None immediate.  PROCEDURE DETAILS:  After administering anesthesia, the patient was identified and a time out was performed and procedure was confirmed.  The patient was placed in dorsal lithotomy position and an exam under anesthesia revealed a mobile fibroid uterus and normal adnexa.  A speculum was placed, cervix was grasped with a tenaculum and sounded to a depth of 10 centimeters.  The cervix was easily dilated to a #27 Pratt dilator.   The Sonata handpiece was then inserted and a complete uterine survey was performed with the ultrasound.  Sonata fibroid ablation was then performed.  Fibroid 1 at 12 o'clock  Ablation 1 : 2.6 x 1.9 cm, 2 min 24 seconds Ablation 2 : 2.3 x 1.0 cm, 1 min 48 seconds  Fibroid 2  at 9 o'clock Ablation : 2.0 x 1.3 cm, 1 minute  All cycles were carried out under direct ultrasound intrauterine guidance and visualization with the ablation guide noted to be within the serosa at all times.  The fibroids appeared ablated with u/s guidance and outgassing noted by  U/S appearance.  Sponge, needle, and instrument account was correct x 2.  All instruments were removed from the vagina.  Hemostasis was assured.  The patient was then awakened and taken to the recovery room I stable condition, tolerating the procedure well.  The patient will be discharged to home as per PACU criteria.  Routine postoperative instructions given.  She was prescribed Ibuprofen.  She will follow up in the clinic in 2-3 weeks  for postoperative evaluation.   Mariel Aloe, MD, FACOG Obstetrician & Gynecologist, [facility] for [facility], [facility]

## 2023-01-31 NOTE — H&P (Signed)
OB/GYN Pre-Op History and Physical  Katie Garrett is a 42 y.o. [id number] presenting for Sonata fibroid ablation.  She has been given risks and benefits of the procedure including bleeding, infection, involvement of other organs as well as uterine perforation.  Pt was not able to get cytotec for cervical ripening.  She has had two previous c sections and no vaginal deliveries.       Past Medical History:  Diagnosis Date   Chlamydia    Fibroid    Headache(784.0)    Pregnancy induced hypertension    Shortness of breath     Past Surgical History:  Procedure Laterality Date   CESAREAN SECTION     CESAREAN SECTION WITH BILATERAL TUBAL LIGATION Bilateral 09/01/2012   Procedure: CESAREAN SECTION WITH BILATERAL TUBAL LIGATION;  Surgeon: Tilda Burrow, MD;  Location: [facility];  Service: Obstetrics;  Laterality: Bilateral;   TOOTH EXTRACTION     TUBAL LIGATION  2014    OB History  Gravida Para Term Preterm AB Living  2 2 2     2   SAB IAB Ectopic Multiple Live Births          2    # Outcome Date GA Lbr Len/2nd Weight Sex Type Anes PTL Lv  2 Term 09/01/12 [redacted]w[redacted]d  2645 g M CS-LTranv Spinal  LIV     Birth Comments: suck blisters noted to left and right hands and left wrist  1 Term 11/13/03    F CS-LTranv   LIV    Social History   Socioeconomic History   Marital status: Widowed    Spouse name: Not on file   Number of children: Not on file   Years of education: Not on file   Highest education level: Not on file  Occupational History   Not on file  Tobacco Use   Smoking status: Former    Current packs/day: 0.00    Types: Cigarettes    Quit date: 04/01/2012    Years since quitting: 10.8   Smokeless tobacco: Never  Vaping Use   Vaping status: Never Used  Substance and Sexual Activity   Alcohol use: No   Drug use: No   Sexual activity: Yes    Partners: Male    Birth control/protection: Surgical  Other Topics Concern   Not on file  Social History Narrative   Not  on file   Social Determinants of Health   Financial Resource Strain: Low Risk  (01/18/2023)   Overall Financial Resource Strain (CARDIA)    Difficulty of Paying Living Expenses: Not very hard  Food Insecurity: No Food Insecurity (01/18/2023)   Hunger Vital Sign    Worried About Running Out of Food in the Last Year: Never true    Ran Out of Food in the Last Year: Never true  Transportation Needs: Unmet Transportation Needs (01/18/2023)   PRAPARE - Transportation    Lack of Transportation (Medical): Yes    Lack of Transportation (Non-Medical): Yes  Physical Activity: Insufficiently Active (01/18/2023)   Exercise Vital Sign    Days of Exercise per Week: 7 days    Minutes of Exercise per Session: 20 min  Stress: No Stress Concern Present (01/18/2023)   [employer] of Occupational Health - Occupational Stress Questionnaire    Feeling of Stress : Not at all  Social Connections: Socially Isolated (01/18/2023)   Social Connection and Isolation Panel [NHANES]    Frequency of Communication with Friends and Family: More than three times a week  Frequency of Social Gatherings with Friends and Family: Never    Attends Religious Services: Never    Database administrator or Organizations: No    Attends Banker Meetings: Never    Marital Status: Widowed    Family History  Problem Relation Age of Onset   Hypertension Mother    Cancer Mother    Hypertension Father     Medications Prior to Admission  Medication Sig Dispense Refill Last Dose   amLODipine (NORVASC) 10 MG tablet Take 1 tablet (10 mg total) by mouth daily. 90 tablet 1 01/31/2023 at 0900   aspirin-acetaminophen-caffeine (EXCEDRIN MIGRAINE) 250-250-65 MG tablet Take 2 tablets by mouth every 6 (six) hours as needed for headache.   01/30/2023   diphenhydramine-acetaminophen (TYLENOL PM) 25-500 MG TABS tablet Take 2 tablets by mouth 2 (two) times a week.   Past Week   ferrous sulfate 325 (65 FE) MG tablet Take 1  tablet (325 mg total) by mouth every other day. 90 tablet 0 01/30/2023   famotidine (PEPCID) 20 MG tablet Take 1 tablet (20 mg total) by mouth daily. 30 tablet 1 Not Taking   medroxyPROGESTERone (PROVERA) 10 MG tablet Take 1 tablet (10 mg total) by mouth daily. (Patient not taking: Reported on 01/30/2023) 30 tablet 2 Not Taking   misoprostol (CYTOTEC) 100 MCG tablet One half tab per vagina the night before and the morning of the procedure (Patient not taking: Reported on 01/30/2023) 1 tablet 0 Not Taking   potassium chloride (KLOR-CON M) 10 MEQ tablet Take 1 tablet (10 mEq total) by mouth daily. 30 tablet 1 Not Taking    Allergies  Allergen Reactions   Onion Anaphylaxis and Swelling    Review of Systems: Negative except for what is mentioned in HPI.     Physical Exam: BP 119/83   Pulse 77   Temp 98.3 F (36.8 C) (Oral)   Resp 20   Ht 5\' 1"  (1.549 m)   Wt 56.2 kg   LMP 01/25/2023 (Approximate)   SpO2 99%   BMI 23.43 kg/m  CONSTITUTIONAL: Well-developed, well-nourished female in no acute distress.  HENT:  Normocephalic, atraumatic, External right and left ear normal. Oropharynx is clear and moist EYES: Conjunctivae and EOM are normal.  NECK: Normal range of motion, supple, no masses SKIN: Skin is warm and dry. No rash noted. Not diaphoretic. No erythema. No pallor. NEUROLGIC: Alert and oriented to person, place, and time. Normal reflexes, muscle tone coordination. No cranial nerve deficit noted. PSYCHIATRIC: Normal mood and affect. Normal behavior. Normal judgment and thought content. CARDIOVASCULAR: Normal heart rate noted, regular rhythm RESPIRATORY: Effort and breath sounds normal, no problems with respiration noted ABDOMEN: Soft, nontender, nondistended. Well-healed Pfannenstiel incision. PELVIC: Deferred MUSCULOSKELETAL: Normal range of motion. No edema and no tenderness. 2+ distal pulses.   Pertinent Labs/Studies:   Results for orders placed or performed during the  hospital encounter of 01/31/23 (from the past 72 hour(s))  CBC     Status: Abnormal   Collection Time: 01/31/23 10:56 AM  Result Value Ref Range   WBC 7.3 4.0 - 10.5 K/uL   RBC 3.96 3.87 - 5.11 MIL/uL   Hemoglobin 9.3 (L) 12.0 - 15.0 g/dL   HCT 32.9 (L) 51.8 - 84.1 %   MCV 76.8 (L) 80.0 - 100.0 fL   MCH 23.5 (L) 26.0 - 34.0 pg   MCHC 30.6 30.0 - 36.0 g/dL   RDW 66.0 (H) 63.0 - 16.0 %   Platelets 380 150 -  400 K/uL   nRBC 0.0 0.0 - 0.2 %    Comment: Performed at [facility], [address] [place], [place] [address]  Pregnancy, urine POC     Status: None   Collection Time: 01/31/23 11:07 AM  Result Value Ref Range   Preg Test, Ur NEGATIVE NEGATIVE    Comment:        THE SENSITIVITY OF THIS METHODOLOGY IS >24 mIU/mL   Type and screen  [facility]     Status: None (Preliminary result)   Collection Time: 01/31/23 11:28 AM  Result Value Ref Range   ABO/RH(D) PENDING    Antibody Screen PENDING    Sample Expiration      02/03/2023,2359 Performed at [facility], [address], [place], [place] [address]   CLINICAL DATA:  Uterine fibroids, LMP 07/16/2022   EXAM: TRANSABDOMINAL AND TRANSVAGINAL ULTRASOUND OF PELVIS   TECHNIQUE: Both transabdominal and transvaginal ultrasound examinations of the pelvis were performed. Transabdominal technique was performed for global imaging of the pelvis including uterus, ovaries, adnexal regions, and pelvic cul-de-sac. It was necessary to proceed with endovaginal exam following the transabdominal exam to visualize the endometrium and ovaries.   COMPARISON:  03/29/2020   FINDINGS: Uterus   Measurements: 10.9 x 4.8 x 6.7 cm = volume: 183 mL. Anteverted. Heterogeneous myometrium. Nabothian cyst at cervix. Several masses are present consistent with leiomyomata. Largest of these is 4.4 cm diameter posterior LEFT upper uterus, likely extends submucosal. Smaller leiomyomata 2.7 cm posteriorly and 1.2 cm at  fundus.   Endometrium   Thickness: 6 mm.  No endometrial fluid or mass   Right ovary   Measurements: 3.7 x 2.0 x 3.0 cm = volume: 11.6 mL. Normal morphology without mass   Left ovary   Measurements: 2.1 x 1.4 x 1.2 cm = volume: 1.8 mL. Normal morphology without mass   Other findings   No free pelvic fluid or adnexal masses.   IMPRESSION: Multiple uterine leiomyomata, largest 4.4 cm diameter likely extending submucosal at posterior upper uterus.   Remainder of exam unremarkable.       Assessment and Plan :Katie Garrett is a 42 y.o. [id number] here for Sonata fibroid ablation due to symptomatic uterine fibroids..   Plan for Sonata procedure NPO Admission labs ordered VS Q4 Risk and benefits again reiterated.   Mariel Aloe, M.D. Attending Obstetrician & Gynecologist, [facility] for [employer], [facility]

## 2023-01-31 NOTE — Anesthesia Procedure Notes (Signed)
Procedure Name: LMA Insertion Date/Time: 01/31/2023 12:50 PM  Performed by: Alwyn Ren, CRNAPre-anesthesia Checklist: Patient identified, Emergency Drugs available, Suction available, Patient being monitored and Timeout performed Patient Re-evaluated:Patient Re-evaluated prior to induction Oxygen Delivery Method: Circle system utilized Preoxygenation: Pre-oxygenation with 100% oxygen Induction Type: IV induction LMA: LMA inserted LMA Size: 4.0 Number of attempts: 1

## 2023-02-01 ENCOUNTER — Ambulatory Visit
Admission: RE | Admit: 2023-02-01 | Discharge: 2023-02-01 | Disposition: A | Discharge status: Home / Self Care | Payer: Commercial Managed Care - PPO | Source: Ambulatory Visit | Attending: Endocrinology | Admitting: Endocrinology

## 2023-02-01 ENCOUNTER — Encounter (HOSPITAL_COMMUNITY): Payer: Self-pay | Admitting: Obstetrics and Gynecology

## 2023-02-01 ENCOUNTER — Encounter (HOSPITAL_BASED_OUTPATIENT_CLINIC_OR_DEPARTMENT_OTHER): Payer: Self-pay | Admitting: Family Medicine

## 2023-02-01 ENCOUNTER — Encounter: Payer: Self-pay | Admitting: Obstetrics and Gynecology

## 2023-02-01 DIAGNOSIS — E042 Nontoxic multinodular goiter: Secondary | ICD-10-CM

## 2023-02-01 DIAGNOSIS — E041 Nontoxic single thyroid nodule: Secondary | ICD-10-CM

## 2023-02-02 ENCOUNTER — Other Ambulatory Visit (HOSPITAL_BASED_OUTPATIENT_CLINIC_OR_DEPARTMENT_OTHER): Payer: Self-pay | Admitting: *Deleted

## 2023-02-02 DIAGNOSIS — I10 Essential (primary) hypertension: Secondary | ICD-10-CM

## 2023-02-02 MED ORDER — AMLODIPINE BESYLATE 10 MG PO TABS: 10.0000 mg | ORAL_TABLET | Freq: Every day | ORAL | 1 refills | Status: DC

## 2023-02-03 ENCOUNTER — Other Ambulatory Visit (HOSPITAL_BASED_OUTPATIENT_CLINIC_OR_DEPARTMENT_OTHER): Payer: Self-pay

## 2023-02-03 DIAGNOSIS — E876 Hypokalemia: Secondary | ICD-10-CM

## 2023-02-06 ENCOUNTER — Other Ambulatory Visit (HOSPITAL_BASED_OUTPATIENT_CLINIC_OR_DEPARTMENT_OTHER): Payer: Commercial Managed Care - PPO

## 2023-02-07 ENCOUNTER — Other Ambulatory Visit (HOSPITAL_BASED_OUTPATIENT_CLINIC_OR_DEPARTMENT_OTHER): Payer: Self-pay

## 2023-02-07 DIAGNOSIS — I10 Essential (primary) hypertension: Secondary | ICD-10-CM

## 2023-02-07 MED ORDER — AMLODIPINE BESYLATE 10 MG PO TABS: 10.0000 mg | ORAL_TABLET | Freq: Every day | ORAL | 1 refills | Status: AC

## 2023-02-09 ENCOUNTER — Other Ambulatory Visit (HOSPITAL_BASED_OUTPATIENT_CLINIC_OR_DEPARTMENT_OTHER): Payer: Commercial Managed Care - PPO

## 2023-02-22 ENCOUNTER — Ambulatory Visit: Payer: Commercial Managed Care - PPO | Admitting: Obstetrics and Gynecology

## 2023-02-22 ENCOUNTER — Encounter: Payer: Self-pay | Admitting: Obstetrics and Gynecology

## 2023-02-22 VITALS — BP 98/62 | HR 81 | Ht 61.0 in | Wt 127.0 lb

## 2023-02-22 DIAGNOSIS — Z4889 Encounter for other specified surgical aftercare: Secondary | ICD-10-CM

## 2023-02-22 NOTE — Progress Notes (Signed)
42 y.o. GYN presents for Post Op follow up of TSU Ablation.

## 2023-02-22 NOTE — Progress Notes (Signed)
    Subjective:    Katie Garrett is a 42 y.o. female who presents to the clinic status post Sonata fibroid ablation on 01/31/23. The patient is not having any pain.  Eating a regular diet without difficulty. Bowel movements are normal. No other significant postoperative concerns.  The following portions of the patient's history were reviewed and updated as appropriate: allergies, current medications, past family history, past medical history, past social history, past surgical history, and problem list..  Last pap smear was normal on 05/07/20.  Review of Systems Pertinent items are noted in HPI.   Objective:   BP 98/62   Pulse 81   Ht 5\' 1"  (1.549 m)   Wt 127 lb (57.6 kg)   LMP 02/17/2023 (Exact Date) Comment: DOS UPERG NEGATIVE  BMI 24.00 kg/m  Constitutional:  Well-developed, well-nourished female in no acute distress.   Skin: Skin is warm and dry, no rash noted, not diaphoretic,no erythema, no pallor.  Cardiovascular: Normal heart rate noted  Respiratory: Effort and breath sounds normal, no problems with respiration noted  Abdomen: Soft, bowel sounds active, non-tender, no abnormal masses  Incision: N/a  Pelvic:    Normal vagina and cervix, moderate menstrual bleeding with small clots, dark purple in color   Surgical pathology () N/a Assessment:   Doing well postoperatively.  Operative findings again reviewed. Pathology report discussed.   Plan:   1. Continue any current medications. 2. Wound care discussed. 3. Activity restrictions: none 4. Anticipated return to work: now. 5. Follow up in 3 months discuss outcome and menstrual bleeding. 6.  Routine preventative health maintenance measures emphasized. Please refer to After Visit Summary for other counseling recommendations.    Mariel Aloe, MD, FACOG Attending Obstetrician & Gynecologist [facility], [facility]

## 2023-02-23 ENCOUNTER — Encounter (HOSPITAL_BASED_OUTPATIENT_CLINIC_OR_DEPARTMENT_OTHER): Payer: Self-pay | Admitting: Family Medicine

## 2023-03-14 ENCOUNTER — Encounter (HOSPITAL_COMMUNITY): Payer: Self-pay

## 2023-03-14 ENCOUNTER — Ambulatory Visit (HOSPITAL_COMMUNITY)
Admission: EM | Admit: 2023-03-14 | Discharge: 2023-03-14 | Disposition: A | Discharge status: Home / Self Care | Payer: Commercial Managed Care - PPO | Attending: Family Medicine | Admitting: Family Medicine

## 2023-03-14 DIAGNOSIS — N3090 Cystitis, unspecified without hematuria: Secondary | ICD-10-CM | POA: Diagnosis not present

## 2023-03-14 LAB — POCT URINALYSIS DIP (MANUAL ENTRY)
Bilirubin, UA: NEGATIVE
Glucose, UA: NEGATIVE mg/dL
Ketones, POC UA: NEGATIVE mg/dL
Nitrite, UA: NEGATIVE
Protein Ur, POC: NEGATIVE mg/dL
Spec Grav, UA: 1.02 (ref 1.010–1.025)
Urobilinogen, UA: 0.2 U/dL
pH, UA: 6 (ref 5.0–8.0)

## 2023-03-14 MED ORDER — PHENAZOPYRIDINE HCL 100 MG PO TABS: 100.0000 mg | ORAL_TABLET | Freq: Three times a day (TID) | ORAL | 0 refills | Status: DC | PRN

## 2023-03-14 MED ORDER — NITROFURANTOIN MONOHYD MACRO 100 MG PO CAPS: 100.0000 mg | ORAL_CAPSULE | Freq: Two times a day (BID) | ORAL | 0 refills | Status: DC

## 2023-03-14 NOTE — ED Provider Notes (Signed)
[facility]    CSN: [id number] Arrival date & time: 03/14/23  1307      History   Chief Complaint Chief Complaint  Patient presents with   Urinary Tract Infection    HPI Katie Garrett is a 42 y.o. female.    Urinary Tract Infection Here for dysuria that began on December 6.  She is also having urinary frequency, and then this morning she started noticing a little streak of blood in her urine.  No fever or chills and no nausea or vomiting.  No allergies to medications  Last menstrual cycle was November 30  Past Medical History:  Diagnosis Date   Chlamydia    Fibroid    Headache(784.0)    Pregnancy induced hypertension    Shortness of breath     Patient Active Problem List   Diagnosis Date Noted   Hypokalemia 01/18/2023   Unintentional weight loss 01/18/2023   Heart palpitations 01/18/2023   Fibroid uterus 07/28/2022    Past Surgical History:  Procedure Laterality Date   CESAREAN SECTION     CESAREAN SECTION WITH BILATERAL TUBAL LIGATION Bilateral 09/01/2012   Procedure: CESAREAN SECTION WITH BILATERAL TUBAL LIGATION;  Surgeon: Tilda Burrow, MD;  Location: [facility];  Service: Obstetrics;  Laterality: Bilateral;   DILATATION & CURETTAGE/HYSTEROSCOPY WITH MYOSURE N/A 01/31/2023   Procedure: DILATATION & CURETTAGE/HYSTEROSCOPY WITH MYOSURE;  Surgeon: Warden Fillers, MD;  Location: [facility] OR;  Service: Gynecology;  Laterality: N/A;   TOOTH EXTRACTION     TUBAL LIGATION  2014    OB History     Gravida  2   Para  2   Term  2   Preterm      AB      Living  2      SAB      IAB      Ectopic      Multiple      Live Births  2            Home Medications    Prior to Admission medications   Medication Sig Start Date End Date Taking? Authorizing Provider  nitrofurantoin, macrocrystal-monohydrate, (MACROBID) 100 MG capsule Take 1 capsule (100 mg total) by mouth 2 (two) times daily. 03/14/23  Yes Zenia Resides, MD   phenazopyridine (PYRIDIUM) 100 MG tablet Take 1 tablet (100 mg total) by mouth 3 (three) times daily as needed (urinary pain). 03/14/23  Yes Zenia Resides, MD  amLODipine (NORVASC) 10 MG tablet Take 1 tablet (10 mg total) by mouth daily. 02/07/23   Alyson Reedy, FNP  aspirin-acetaminophen-caffeine (EXCEDRIN MIGRAINE) [phone] MG tablet Take 2 tablets by mouth every 6 (six) hours as needed for headache.    [provider]  diphenhydramine-acetaminophen (TYLENOL PM) 25-500 MG TABS tablet Take 2 tablets by mouth 2 (two) times a week.    [provider]  famotidine (PEPCID) 20 MG tablet Take 1 tablet (20 mg total) by mouth daily. 10/21/22   Peter Garter, PA  ferrous sulfate 325 (65 FE) MG tablet Take 1 tablet (325 mg total) by mouth every other day. 12/19/22   Alyson Reedy, FNP  ibuprofen (ADVIL) 600 MG tablet Take 1 tablet (600 mg total) by mouth every 6 (six) hours as needed for headache, mild pain (pain score 1-3), moderate pain (pain score 4-6) or cramping. 01/31/23   Warden Fillers, MD    Family History Family History  Problem Relation Age of Onset   Hypertension Mother  Cancer Mother    Hypertension Father     Social History Social History   Tobacco Use   Smoking status: Former    Current packs/day: 0.00    Types: Cigarettes    Quit date: 04/01/2012    Years since quitting: 10.9   Smokeless tobacco: Never  Vaping Use   Vaping status: Never Used  Substance Use Topics   Alcohol use: No   Drug use: No     Allergies   Onion   Review of Systems Review of Systems   Physical Exam Triage Vital Signs ED Triage Vitals  Encounter Vitals Group     BP 03/14/23 1351 122/75     Systolic BP Percentile --      Diastolic BP Percentile --      Pulse Rate 03/14/23 1351 81     Resp 03/14/23 1351 18     Temp 03/14/23 1351 98 F (36.7 C)     Temp Source 03/14/23 1351 Oral     SpO2 03/14/23 1351 100 %     Weight --      Height --      Head  Circumference --      Peak Flow --      Pain Score 03/14/23 1352 0     Pain Loc --      Pain Education --      Exclude from Growth Chart --    No data found.  Updated Vital Signs BP 122/75 (BP Location: Right Arm)   Pulse 81   Temp 98 F (36.7 C) (Oral)   Resp 18   LMP 03/04/2023 (Approximate) Comment: DOS UPERG NEGATIVE  SpO2 100%   Visual Acuity Right Eye Distance:   Left Eye Distance:   Bilateral Distance:    Right Eye Near:   Left Eye Near:    Bilateral Near:     Physical Exam Vitals reviewed.  Constitutional:      General: She is not in acute distress.    Appearance: She is not ill-appearing, toxic-appearing or diaphoretic.  HENT:     Mouth/Throat:     Mouth: Mucous membranes are moist.  Eyes:     Extraocular Movements: Extraocular movements intact.     Conjunctiva/sclera: Conjunctivae normal.     Pupils: Pupils are equal, round, and reactive to light.  Cardiovascular:     Rate and Rhythm: Normal rate and regular rhythm.     Heart sounds: No murmur heard. Pulmonary:     Effort: Pulmonary effort is normal.     Breath sounds: Normal breath sounds.  Abdominal:     Palpations: Abdomen is soft.     Tenderness: There is no abdominal tenderness. There is no right CVA tenderness or left CVA tenderness.  Musculoskeletal:     Cervical back: Neck supple.  Lymphadenopathy:     Cervical: No cervical adenopathy.  Skin:    Coloration: Skin is not pale.  Neurological:     General: No focal deficit present.     Mental Status: She is alert and oriented to person, place, and time.  Psychiatric:        Behavior: Behavior normal.      UC Treatments / Results  Labs (all labs ordered are listed, but only abnormal results are displayed) Labs Reviewed  POCT URINALYSIS DIP (MANUAL ENTRY) - Abnormal; Notable for the following components:      Result Value   Color, UA straw (*)    Clarity, UA cloudy (*)  Blood, UA small (*)    Leukocytes, UA Moderate (2+) (*)     All other components within normal limits  URINE CULTURE    EKG   Radiology No results found.  Procedures Procedures (including critical care time)  Medications Ordered in UC Medications - No data to display  Initial Impression / Assessment and Plan / UC Course  I have reviewed the triage vital signs and the nursing notes.  Pertinent labs & imaging results that were available during my care of the patient were reviewed by me and considered in my medical decision making (see chart for details).     Urinalysis shows small amount of blood and moderate amount of leukocytes.  Macrobid is sent in to treat the UTI and Pyridium sent in for the symptoms.  Urine culture is sent and we will notify her if it looks like antibiotic needs to be changed Final Clinical Impressions(s) / UC Diagnoses   Final diagnoses:  Cystitis     Discharge Instructions      Urinalysis had red blood cells and white blood cells on it.  This is consistent with a bladder infection.  Take nitrofurantoin 100 mg--1 capsule 2 times daily for 5 days  Take Pyridium/phenazopyridine 100 mg--1 tablet 3 times daily as needed for urinary pain.  This medication usually makes the urine orange  Urine culture is sent and if it looks like the antibiotic needs to be changed, our staff will call you     ED Prescriptions     Medication Sig Dispense Auth. Provider   nitrofurantoin, macrocrystal-monohydrate, (MACROBID) 100 MG capsule Take 1 capsule (100 mg total) by mouth 2 (two) times daily. 10 capsule Zenia Resides, MD   phenazopyridine (PYRIDIUM) 100 MG tablet Take 1 tablet (100 mg total) by mouth 3 (three) times daily as needed (urinary pain). 10 tablet Marlinda Mike Janace Aris, MD      PDMP not reviewed this encounter.   Zenia Resides, MD 03/14/23 [phone]

## 2023-03-14 NOTE — Discharge Instructions (Signed)
Urinalysis had red blood cells and white blood cells on it.  This is consistent with a bladder infection.  Take nitrofurantoin 100 mg--1 capsule 2 times daily for 5 days  Take Pyridium/phenazopyridine 100 mg--1 tablet 3 times daily as needed for urinary pain.  This medication usually makes the urine orange  Urine culture is sent and if it looks like the antibiotic needs to be changed, our staff will call you

## 2023-03-14 NOTE — ED Triage Notes (Signed)
Pt c/o bladder pain, burning on urination, and blood in urine x3-4 days. Hx of having fibroids removed. 10/29.

## 2023-03-16 LAB — URINE CULTURE: Culture: 40000 — AB

## 2023-04-12 ENCOUNTER — Emergency Department (HOSPITAL_COMMUNITY)
Admission: EM | Admit: 2023-04-12 | Discharge: 2023-04-12 | Disposition: A | Discharge status: Home / Self Care | Payer: Commercial Managed Care - PPO | Attending: Emergency Medicine | Admitting: Emergency Medicine

## 2023-04-12 ENCOUNTER — Other Ambulatory Visit: Payer: Self-pay

## 2023-04-12 DIAGNOSIS — S025XXA Fracture of tooth (traumatic), initial encounter for closed fracture: Secondary | ICD-10-CM | POA: Insufficient documentation

## 2023-04-12 DIAGNOSIS — X58XXXA Exposure to other specified factors, initial encounter: Secondary | ICD-10-CM | POA: Insufficient documentation

## 2023-04-12 DIAGNOSIS — K0889 Other specified disorders of teeth and supporting structures: Secondary | ICD-10-CM

## 2023-04-12 MED ORDER — ONDANSETRON 4 MG PO TBDP: ORAL_TABLET | ORAL | 0 refills | Status: AC

## 2023-04-12 MED ORDER — IBUPROFEN 400 MG PO TABS
600.0000 mg | ORAL_TABLET | Freq: Once | ORAL | Status: AC
  Administered 2023-04-12: 600 mg via ORAL

## 2023-04-12 MED ORDER — AMOXICILLIN 500 MG PO CAPS
500.0000 mg | ORAL_CAPSULE | Freq: Three times a day (TID) | ORAL | 0 refills | Status: DC
Start: 2023-04-12 — End: 2023-07-25

## 2023-04-12 MED ORDER — IBUPROFEN 200 MG PO TABS
ORAL_TABLET | ORAL | Status: AC
  Filled 2023-04-12: qty 3

## 2023-04-12 NOTE — ED Triage Notes (Signed)
 Pt. Stated, I have a bad tooth top right hand corner since yesterday.

## 2023-04-12 NOTE — Discharge Instructions (Addendum)
 Take Tylenol every 4 hours and ibuprofen every 6 hours needed for pain.  Take antibiotics as needed.  Take Zofran every 6 as needed for nausea and vomiting.  Follow-up with a dentist

## 2023-04-12 NOTE — ED Provider Notes (Signed)
 [place] EMERGENCY DEPARTMENT AT [facility] Provider Note   CSN: [id number] Arrival date & time: 04/12/23  0746     History  Chief Complaint  Patient presents with   Dental Problem    Katie Garrett is a 43 y.o. female.  Patient presents with right upper tooth pain since yesterday.  Patient's had dental issues in the past and a broken tooth.  Currently does not have a dentist but has seen a dentist in the past.  No fevers or chills.  Patient had vomiting with the pain this morning.  Minimal swelling.  The history is provided by the patient.       Home Medications Prior to Admission medications   Medication Sig Start Date End Date Taking? Authorizing Provider  amoxicillin  (AMOXIL ) 500 MG capsule Take 1 capsule (500 mg total) by mouth 3 (three) times daily. 04/12/23  Yes Tonia Chew, MD  ondansetron  (ZOFRAN -ODT) 4 MG disintegrating tablet 4mg  ODT q6 hours prn nausea/vomit 04/12/23  Yes Deon Ivey, MD  amLODipine  (NORVASC ) 10 MG tablet Take 1 tablet (10 mg total) by mouth daily. 02/07/23   Towana Small, FNP  aspirin-acetaminophen -caffeine  (EXCEDRIN MIGRAINE) 250-250-65 MG tablet Take 2 tablets by mouth every 6 (six) hours as needed for headache.    [provider]  diphenhydramine -acetaminophen  (TYLENOL  PM) 25-500 MG TABS tablet Take 2 tablets by mouth 2 (two) times a week.    [provider]  famotidine  (PEPCID ) 20 MG tablet Take 1 tablet (20 mg total) by mouth daily. 10/21/22   Silver Wonda LABOR, PA  ferrous sulfate  325 (65 FE) MG tablet Take 1 tablet (325 mg total) by mouth every other day. 12/19/22   Butler, Kristina, FNP  ibuprofen  (ADVIL ) 600 MG tablet Take 1 tablet (600 mg total) by mouth every 6 (six) hours as needed for headache, mild pain (pain score 1-3), moderate pain (pain score 4-6) or cramping. 01/31/23   Zina Jerilynn LABOR, MD  nitrofurantoin , macrocrystal-monohydrate, (MACROBID ) 100 MG capsule Take 1 capsule (100 mg total) by  mouth 2 (two) times daily. 03/14/23   Vonna Sharlet POUR, MD  phenazopyridine  (PYRIDIUM ) 100 MG tablet Take 1 tablet (100 mg total) by mouth 3 (three) times daily as needed (urinary pain). 03/14/23   Vonna Sharlet POUR, MD      Allergies    Onion    Review of Systems   Review of Systems  Constitutional:  Negative for chills and fever.  HENT:  Positive for dental problem. Negative for congestion.   Eyes:  Negative for visual disturbance.  Respiratory:  Negative for shortness of breath.   Cardiovascular:  Negative for chest pain.  Gastrointestinal:  Positive for vomiting. Negative for abdominal pain.  Genitourinary:  Negative for dysuria and flank pain.  Musculoskeletal:  Negative for back pain, neck pain and neck stiffness.  Skin:  Negative for rash.  Neurological:  Negative for light-headedness and headaches.    Physical Exam Updated Vital Signs BP (!) 133/90 (BP Location: Right Arm)   Pulse 90   Temp 98.6 F (37 C)   Resp 16   Ht 5' 1 (1.549 m)   Wt 60.8 kg   LMP 03/18/2023 (Approximate) Comment: DOS UPERG NEGATIVE  SpO2 100%   BMI 25.32 kg/m  Physical Exam Vitals and nursing note reviewed.  Constitutional:      General: She is not in acute distress.    Appearance: She is well-developed.  HENT:     Head: Normocephalic and atraumatic.  Comments: Posterior molar right upper fractured tooth, no pulp visualized, mild tenderness to palpation of gingiva without fluctuance or abscess no trismus.  No significant submandibular swelling or adenopathy.    Mouth/Throat:     Mouth: Mucous membranes are moist.  Eyes:     General:        Right eye: No discharge.        Left eye: No discharge.     Conjunctiva/sclera: Conjunctivae normal.  Neck:     Trachea: No tracheal deviation.  Cardiovascular:     Rate and Rhythm: Normal rate.  Pulmonary:     Effort: Pulmonary effort is normal.  Abdominal:     General: There is no distension.     Palpations: Abdomen is soft.      Tenderness: There is no abdominal tenderness. There is no guarding.  Musculoskeletal:     Cervical back: Normal range of motion and neck supple. No rigidity.  Skin:    General: Skin is warm.     Capillary Refill: Capillary refill takes less than 2 seconds.     Findings: No rash.  Neurological:     General: No focal deficit present.     Mental Status: She is alert.     Cranial Nerves: No cranial nerve deficit.  Psychiatric:        Mood and Affect: Mood normal.     ED Results / Procedures / Treatments   Labs (all labs ordered are listed, but only abnormal results are displayed) Labs Reviewed - No data to display  EKG None  Radiology No results found.  Procedures Procedures    Medications Ordered in ED Medications - No data to display  ED Course/ Medical Decision Making/ A&P                                 Medical Decision Making Risk Prescription drug management.   Patient presents with dental pain concern for gingivitis/apical abscess.  No drainable abscess palpated.  No trismus or deep space infection that require CT scan at this time.  Discussed pain meds, antibiotics and follow-up with a dentist.  Resource guide given.  Work note given patient comfortable plan.        Final Clinical Impression(s) / ED Diagnoses Final diagnoses:  Pain, dental    Rx / DC Orders ED Discharge Orders          Ordered    amoxicillin  (AMOXIL ) 500 MG capsule  3 times daily        04/12/23 0947    ondansetron  (ZOFRAN -ODT) 4 MG disintegrating tablet        04/12/23 0947              Tonia Chew, MD 04/12/23 [phone]

## 2023-04-12 NOTE — ED Notes (Signed)
 RIGHT SIDE OF CHEEK IS SWOLLEN AND PAINFUL

## 2023-05-11 ENCOUNTER — Ambulatory Visit
Admission: RE | Admit: 2023-05-11 | Discharge: 2023-05-11 | Disposition: A | Discharge status: Home / Self Care | Payer: Commercial Managed Care - PPO | Source: Ambulatory Visit | Attending: Endocrinology | Admitting: Endocrinology

## 2023-05-17 ENCOUNTER — Encounter: Payer: Self-pay | Admitting: Endocrinology

## 2023-07-10 ENCOUNTER — Other Ambulatory Visit: Payer: Self-pay

## 2023-07-10 DIAGNOSIS — E042 Nontoxic multinodular goiter: Secondary | ICD-10-CM

## 2023-07-20 ENCOUNTER — Other Ambulatory Visit: Payer: Commercial Managed Care - PPO

## 2023-07-25 ENCOUNTER — Ambulatory Visit: Admission: EM | Admit: 2023-07-25 | Discharge: 2023-07-25 | Disposition: A | Discharge status: Home / Self Care

## 2023-07-25 DIAGNOSIS — K047 Periapical abscess without sinus: Secondary | ICD-10-CM | POA: Diagnosis not present

## 2023-07-25 MED ORDER — AMOXICILLIN-POT CLAVULANATE 875-125 MG PO TABS: 1.0000 | ORAL_TABLET | Freq: Two times a day (BID) | ORAL | 0 refills | Status: DC

## 2023-07-25 MED ORDER — NAPROXEN 500 MG PO TABS: 500.0000 mg | ORAL_TABLET | Freq: Two times a day (BID) | ORAL | 0 refills | Status: AC | PRN

## 2023-07-25 MED ORDER — KETOROLAC TROMETHAMINE 30 MG/ML IJ SOLN
30.0000 mg | Freq: Once | INTRAMUSCULAR | Status: AC
  Administered 2023-07-25: 30 mg via INTRAMUSCULAR

## 2023-07-25 NOTE — Discharge Instructions (Addendum)
 Do not take any ibuprofen , Naprosyn , Excedrin or aspirin at least 8 hours after receiving Toradol  injection. Antibiotics take twice daily till complete.  Follow-up with urgent tooth to schedule an appointment for evaluation of teeth traction.

## 2023-07-25 NOTE — ED Provider Notes (Signed)
 [facility]    CSN: [id number] Arrival date & time: 07/25/23  1049      History   Chief Complaint Chief Complaint  Patient presents with   Dental Pain   Headache    HPI Rhen L Tisdale-Johnson is a 43 y.o. female.   Here for evaluation of a dental infection involving the top right molar tooth.  She reports the tooth pain is causing her to have a headache.  She broke her tooth about a year ago and has been unable to follow-up with a dental provider to have the residual pieces of the tooth extracted.  Concerned that she did have an abscess.  She has not had a fever.  She has mild facial tenderness and swelling.  Past Medical History:  Diagnosis Date   Chlamydia    Fibroid    Headache(784.0)    Pregnancy induced hypertension    Shortness of breath     Patient Active Problem List   Diagnosis Date Noted   Hypokalemia 01/18/2023   Unintentional weight loss 01/18/2023   Heart palpitations 01/18/2023   Fibroid uterus 07/28/2022    Past Surgical History:  Procedure Laterality Date   CESAREAN SECTION     CESAREAN SECTION WITH BILATERAL TUBAL LIGATION Bilateral 09/01/2012   Procedure: CESAREAN SECTION WITH BILATERAL TUBAL LIGATION;  Surgeon: Albino Hum, MD;  Location: [facility];  Service: Obstetrics;  Laterality: Bilateral;   DILATATION & CURETTAGE/HYSTEROSCOPY WITH MYOSURE N/A 01/31/2023   Procedure: DILATATION & CURETTAGE/HYSTEROSCOPY WITH MYOSURE;  Surgeon: Abigail Abler, MD;  Location: [facility];  Service: Gynecology;  Laterality: N/A;   TOOTH EXTRACTION     TUBAL LIGATION  2014    OB History     Gravida  2   Para  2   Term  2   Preterm      AB      Living  2      SAB      IAB      Ectopic      Multiple      Live Births  2            Home Medications    Prior to Admission medications   Medication Sig Start Date End Date Taking? Authorizing Provider  amLODipine  (NORVASC ) 10 MG tablet Take 1 tablet (10 mg total) by mouth  daily. 02/07/23  Yes Butler, Kristina, FNP  amoxicillin -clavulanate (AUGMENTIN ) 875-125 MG tablet Take 1 tablet by mouth 2 (two) times daily. 07/25/23  Yes Buena Carmine, NP  ferrous sulfate  325 (65 FE) MG tablet Take 1 tablet (325 mg total) by mouth every other day. 12/19/22  Yes Wilhelmena Hanson, FNP  naproxen  (NAPROSYN ) 500 MG tablet Take 1 tablet (500 mg total) by mouth 2 (two) times daily between meals as needed. 07/25/23  Yes Buena Carmine, NP  potassium chloride  (KLOR-CON  M) 10 MEQ tablet Take 10 mEq by mouth daily. 07/20/23  Yes [provider]  aspirin-acetaminophen -caffeine  (EXCEDRIN MIGRAINE) 250-250-65 MG tablet Take 2 tablets by mouth every 6 (six) hours as needed for headache.    [provider]  diphenhydramine -acetaminophen  (TYLENOL  PM) 25-500 MG TABS tablet Take 2 tablets by mouth 2 (two) times a week.    [provider]  famotidine  (PEPCID ) 20 MG tablet Take 1 tablet (20 mg total) by mouth daily. 10/21/22   Cedar Butter, PA  nitrofurantoin , macrocrystal-monohydrate, (MACROBID ) 100 MG capsule Take 1 capsule (100 mg total) by mouth 2 (two) times daily.  03/14/23   Ann Keto, MD  ondansetron  (ZOFRAN -ODT) 4 MG disintegrating tablet 4mg  ODT q6 hours prn nausea/vomit 04/12/23   Zavitz, Joshua, MD  phenazopyridine  (PYRIDIUM ) 100 MG tablet Take 1 tablet (100 mg total) by mouth 3 (three) times daily as needed (urinary pain). 03/14/23   Ann Keto, MD    Family History Family History  Problem Relation Age of Onset   Hypertension Mother    Cancer Mother    Hypertension Father     Social History Social History   Tobacco Use   Smoking status: Former    Current packs/day: 0.00    Types: Cigarettes    Quit date: 04/01/2012    Years since quitting: 11.3   Smokeless tobacco: Never  Vaping Use   Vaping status: Never Used  Substance Use Topics   Alcohol use: No   Drug use: No     Allergies   Onion   Review of Systems Review  of Systems  Neurological:  Positive for headaches.     Physical Exam Triage Vital Signs ED Triage Vitals  Encounter Vitals Group     BP 07/25/23 1104 107/72     Systolic BP Percentile --      Diastolic BP Percentile --      Pulse Rate 07/25/23 1104 69     Resp 07/25/23 1104 18     Temp 07/25/23 1104 97.9 F (36.6 C)     Temp Source 07/25/23 1104 Oral     SpO2 07/25/23 1104 99 %     Weight 07/25/23 1101 137 lb (62.1 kg)     Height 07/25/23 1101 5\' 1"  (1.549 m)     Head Circumference --      Peak Flow --      Pain Score 07/25/23 1058 10     Pain Loc --      Pain Education --      Exclude from Growth Chart --    No data found.  Updated Vital Signs BP 107/72 (BP Location: Right Arm)   Pulse 69   Temp 97.9 F (36.6 C) (Oral)   Resp 18   Ht 5\' 1"  (1.549 m)   Wt 137 lb (62.1 kg)   LMP 07/04/2023 (Approximate)   SpO2 99%   BMI 25.89 kg/m   Visual Acuity Right Eye Distance:   Left Eye Distance:   Bilateral Distance:    Right Eye Near:   Left Eye Near:    Bilateral Near:     Physical Exam Vitals and nursing note reviewed.  Constitutional:      Appearance: She is well-developed.  HENT:     Head: Normocephalic and atraumatic.     Mouth/Throat:     Dentition: Abnormal dentition. Dental tenderness and dental caries present.  Eyes:     Extraocular Movements: Extraocular movements intact.  Cardiovascular:     Rate and Rhythm: Normal rate and regular rhythm.  Pulmonary:     Effort: Pulmonary effort is normal.     Breath sounds: Normal breath sounds.  Musculoskeletal:     Cervical back: Normal range of motion and neck supple.  Lymphadenopathy:     Cervical: No cervical adenopathy.  Neurological:     Mental Status: She is alert and oriented to person, place, and time.     GCS: GCS eye subscore is 4. GCS verbal subscore is 5. GCS motor subscore is 6.      UC Treatments / Results  Labs (all labs ordered are listed,  but only abnormal results are  displayed) Labs Reviewed - No data to display  EKG   Radiology No results found.  Procedures Procedures (including critical care time)  Medications Ordered in UC Medications  ketorolac  (TORADOL ) 30 MG/ML injection 30 mg (30 mg Intramuscular Given 07/25/23 1509)    Initial Impression / Assessment and Plan / UC Course  I have reviewed the triage vital signs and the nursing notes.  Pertinent labs & imaging results that were available during my care of the patient were reviewed by me and considered in my medical decision making (see chart for details).   Dental infection treatment with Augmentin  twice daily for 7 days.  For acute pain Naprosyn  500 mg twice daily as needed.  List of dental resources provided to patient to follow-up as tooth will need to be extracted prevent recurrent infections. Final Clinical Impressions(s) / UC Diagnoses   Final diagnoses:  Dental infection     Discharge Instructions      Do not take any ibuprofen , Naprosyn , Excedrin or aspirin at least 8 hours after receiving Toradol  injection. Antibiotics take twice daily till complete.  Follow-up with urgent tooth to schedule an appointment for evaluation of teeth traction.     ED Prescriptions     Medication Sig Dispense Auth. Provider   amoxicillin -clavulanate (AUGMENTIN ) 875-125 MG tablet Take 1 tablet by mouth 2 (two) times daily. 14 tablet Buena Carmine, NP   naproxen  (NAPROSYN ) 500 MG tablet Take 1 tablet (500 mg total) by mouth 2 (two) times daily between meals as needed. 30 tablet Buena Carmine, NP      PDMP not reviewed this encounter.   Buena Carmine, NP 07/27/23 [phone]

## 2023-07-25 NOTE — ED Triage Notes (Signed)
"  I am having dental pain (hole in my gum) on the top right side causing a ha for 3 days". "I did think the ha may be my BP but I think it is the tooth". No fever known.

## 2023-07-27 ENCOUNTER — Ambulatory Visit: Payer: Commercial Managed Care - PPO | Admitting: Endocrinology

## 2023-08-07 ENCOUNTER — Encounter: Payer: Self-pay | Admitting: Endocrinology

## 2023-08-07 ENCOUNTER — Ambulatory Visit (INDEPENDENT_AMBULATORY_CARE_PROVIDER_SITE_OTHER): Admitting: Endocrinology

## 2023-08-07 VITALS — BP 114/72 | HR 94 | Ht 61.0 in | Wt 137.0 lb

## 2023-08-07 DIAGNOSIS — R002 Palpitations: Secondary | ICD-10-CM | POA: Diagnosis not present

## 2023-08-07 DIAGNOSIS — E041 Nontoxic single thyroid nodule: Secondary | ICD-10-CM | POA: Diagnosis not present

## 2023-08-07 DIAGNOSIS — E042 Nontoxic multinodular goiter: Secondary | ICD-10-CM | POA: Diagnosis not present

## 2023-08-07 NOTE — Progress Notes (Signed)
 Outpatient Endocrinology Note Katie Maci Eickholt, MD  08/07/23  Patient's Name: Katie Garrett    DOB: 1980/08/11    MRN: [id number]  REASON OF VISIT: Follow-up for thyroid  nodules   PCP: Wilhelmena Hanson, FNP  REFERRING PROVIDER: Wilhelmena Hanson, FNP  HISTORY OF PRESENT ILLNESS:   Katie Garrett is a 42 y.o. old female with past medical history as listed below is presented for follow-up of thyroid  nodules.  Pertinent Thyroid  History:  - Patient had palpable thyroid  nodules/goiter and had ultrasound thyroid  in November 03, 2022 showed multinodular goiter with left mid thyroid  nodule measuring 4 .3 x 3.5 x 2.6 cm solid isoechoic and several scattered cystic nodules throughout the right lobe largest measuring 1.2 cm, she underwent FNA biopsy of left dominant thyroid  nodule measuring 4.8 cm on December 28, 2022 cytology consistent with benign follicular nodule. -CT neck in April 2024 showed enlarged thyroid  heterogenous including dominant anterior left thyroid  measuring 3.6 cm. -Patient has occasional discomfort and feels like neck swelling.  She is euthyroid not on thyroid  medication. -No family history of thyroid  cancer and no radiation exposure to head and neck.  - FNA of left thyroid  nodule on 12/28/2022: With benign cytology.   Clinical History: Location: Left; Mid, Maximum Size: 4.3 cm; Other 2  dimensions: 3.5 x 2.6 cm, solid/almost completely solid (2), Isoechoic  (1), ACR-TI-RADS Total Points 3.  Specimen Submitted:  A. THYROID , LEFT LOBE, FINE NEEDLE ASPIRATION   FINAL MICROSCOPIC DIAGNOSIS:  - Benign follicular nodule (Bethesda category II)   Repeat ultrasound thyroid  in May 11, 2023 : Stable left thyroid  nodule measuring 4.1 cm in maximal dimension.  Small stable cystic/complex right thyroid  nodules.  Interval history  Patient complains of occasional palpitation especially while walking and climbing stairs.  She also complains of occasional leg swelling  and neck discomfort.  Denies increased sweating and heat intolerance.  She has occasional constipation, denies diarrhea.  She has been slowly losing body weight.  No other complaints today.  Discussed that palpitation can be multifactorial, asked to follow-up and talk with primary care provider as well for palpitation.  Will investigate today in regard to thyroid  disorder.  Ultrasound thyroid  in May 11, 2023 reviewed images and compared with ultrasound in August 2024 : Left thyroid  nodule is stable solid heterogeneous measuring 4.1 cm.  CLINICAL DATA:  Thyroid  nodule follow-up   EXAM: THYROID  ULTRASOUND   TECHNIQUE: Ultrasound examination of the thyroid  gland and adjacent soft tissues was performed.   COMPARISON:  12/28/2022   11/03/2022   FINDINGS: Parenchymal Echotexture: Mildly heterogenous   Isthmus: 0.7 cm   Right lobe: 5.1 x 2.6 x 2.3 cm   Left lobe: 6.2 x 3.8 x 4.1 cm   _________________________________________________________   Estimated total number of nodules >/= 1 cm: 2   Number of spongiform nodules >/=  2 cm not described below (TR1): 0   Number of mixed cystic and solid nodules >/= 1.5 cm not described below (TR2): 0   _________________________________________________________   Nodule 1: 0.8 x 0.8 x 0.5 cm mixed solid cystic isoechoic right superior thyroid  nodule (TI-RADS 2) is unchanged in size. It does not meet criteria for imaging surveillance or FNA.   Nodule 2: 1.2 x 0.9 x 0.7 cm cystic nodule in the mid right thyroid  lobe (TI-RADS 1) is unchanged in size since prior examination. It does not meet criteria for imaging surveillance or FNA.   Nodule 3: 4.1 x 3.2 x 2.6 cm solid isoechoic left mid thyroid   nodule (TI-RADS 3) is unchanged in size since 11/03/2022. Please correlate with prior FNA results from 12/28/2022.   No new thyroid  nodules.   IMPRESSION: Previously biopsied left mid thyroid  nodule is unchanged in size. Please correlate with  prior FNA results from 12/28/2022.   The above is in keeping with the ACR TI-RADS recommendations - J Am Coll Radiol 2017;14:587-595.    REVIEW OF SYSTEMS:  As per history of present illness.   PAST MEDICAL HISTORY: Past Medical History:  Diagnosis Date   Chlamydia    Fibroid    Headache(784.0)    Pregnancy induced hypertension    Shortness of breath     PAST SURGICAL HISTORY: Past Surgical History:  Procedure Laterality Date   CESAREAN SECTION     CESAREAN SECTION WITH BILATERAL TUBAL LIGATION Bilateral 09/01/2012   Procedure: CESAREAN SECTION WITH BILATERAL TUBAL LIGATION;  Surgeon: Albino Hum, MD;  Location: [facility];  Service: Obstetrics;  Laterality: Bilateral;   DILATATION & CURETTAGE/HYSTEROSCOPY WITH MYOSURE N/A 01/31/2023   Procedure: DILATATION & CURETTAGE/HYSTEROSCOPY WITH MYOSURE;  Surgeon: Abigail Abler, MD;  Location: [facility] OR;  Service: Gynecology;  Laterality: N/A;   TOOTH EXTRACTION     TUBAL LIGATION  2014    ALLERGIES: Allergies  Allergen Reactions   Onion Anaphylaxis and Swelling    FAMILY HISTORY:  Family History  Problem Relation Age of Onset   Hypertension Mother    Cancer Mother    Hypertension Father     SOCIAL HISTORY: Social History   Socioeconomic History   Marital status: Widowed    Spouse name: Not on file   Number of children: Not on file   Years of education: Not on file   Highest education level: Not on file  Occupational History   Not on file  Tobacco Use   Smoking status: Former    Current packs/day: 0.00    Types: Cigarettes    Quit date: 04/01/2012    Years since quitting: 11.3   Smokeless tobacco: Never  Vaping Use   Vaping status: Never Used  Substance and Sexual Activity   Alcohol use: No   Drug use: No   Sexual activity: Yes    Partners: Male    Birth control/protection: Surgical  Other Topics Concern   Not on file  Social History Narrative   Not on file   Social Drivers of Health   Financial  Resource Strain: Low Risk  (01/18/2023)   Overall Financial Resource Strain (CARDIA)    Difficulty of Paying Living Expenses: Not very hard  Food Insecurity: No Food Insecurity (01/18/2023)   Hunger Vital Sign    Worried About Running Out of Food in the Last Year: Never true    Ran Out of Food in the Last Year: Never true  Transportation Needs: Unmet Transportation Needs (01/18/2023)   PRAPARE - Transportation    Lack of Transportation (Medical): Yes    Lack of Transportation (Non-Medical): Yes  Physical Activity: Insufficiently Active (01/18/2023)   Exercise Vital Sign    Days of Exercise per Week: 7 days    Minutes of Exercise per Session: 20 min  Stress: No Stress Concern Present (01/18/2023)   [employer] of Occupational Health - Occupational Stress Questionnaire    Feeling of Stress : Not at all  Social Connections: Socially Isolated (01/18/2023)   Social Connection and Isolation Panel [NHANES]    Frequency of Communication with Friends and Family: More than three times a week    Frequency of  Social Gatherings with Friends and Family: Never    Attends Religious Services: Never    Database administrator or Organizations: No    Attends Banker Meetings: Never    Marital Status: Widowed    MEDICATIONS:  Current Outpatient Medications  Medication Sig Dispense Refill   amLODipine  (NORVASC ) 10 MG tablet Take 1 tablet (10 mg total) by mouth daily. 90 tablet 1   aspirin-acetaminophen -caffeine  (EXCEDRIN MIGRAINE) 250-250-65 MG tablet Take 2 tablets by mouth every 6 (six) hours as needed for headache.     famotidine  (PEPCID ) 20 MG tablet Take 1 tablet (20 mg total) by mouth daily. 30 tablet 1   ferrous sulfate  325 (65 FE) MG tablet Take 1 tablet (325 mg total) by mouth every other day. 90 tablet 0   naproxen  (NAPROSYN ) 500 MG tablet Take 1 tablet (500 mg total) by mouth 2 (two) times daily between meals as needed. 30 tablet 0   ondansetron  (ZOFRAN -ODT) 4 MG  disintegrating tablet 4mg  ODT q6 hours prn nausea/vomit 5 tablet 0   potassium chloride  (KLOR-CON  M) 10 MEQ tablet Take 10 mEq by mouth daily.     diphenhydramine -acetaminophen  (TYLENOL  PM) 25-500 MG TABS tablet Take 2 tablets by mouth 2 (two) times a week. (Patient not taking: Reported on 08/07/2023)     nitrofurantoin , macrocrystal-monohydrate, (MACROBID ) 100 MG capsule Take 1 capsule (100 mg total) by mouth 2 (two) times daily. (Patient not taking: Reported on 08/07/2023) 10 capsule 0   No current facility-administered medications for this visit.    PHYSICAL EXAM: Vitals:   08/07/23 1052  BP: 114/72  Pulse: 94  SpO2: 99%  Weight: 137 lb (62.1 kg)  Height: 5\' 1"  (1.549 m)   Body mass index is 25.89 kg/m.    General: Well developed, well nourished female in no apparent distress.  HEENT: AT/[place], no external lesions. Hearing intact to the spoken word Eyes: EOMI. No stare, proptosis. Conjunctiva clear and no icterus. Neck: Neck supple with significant thyromegaly 3-4 X normal or no lymphadenopathy and palpable thyroid  nodule + Lungs: Clear to auscultation, no wheeze. Respirations not labored Heart: S1S2, Regular in rate and rhythm.  Abdomen: Soft, non tender, non distended Neurologic: Alert, oriented, normal speech, deep tendon biceps reflexes normal,  no gross focal neurological deficit Extremities: No pedal pitting edema, no tremors of outstretched hands Skin: Warm, color good.  Psychiatric: Does not appear depressed or anxious  PERTINENT HISTORIC LABORATORY AND IMAGING STUDIES:  All pertinent laboratory results were reviewed. Please see HPI also for further details.   TSH  Date Value Ref Range Status  11/18/2022 1.150 0.450 - 4.500 uIU/mL Final  10/21/2022 0.602 0.350 - 4.500 uIU/mL Final    Comment:    Performed by a 3rd Generation assay with a functional sensitivity of <=0.01 uIU/mL. Performed at [facility], [address], [place], [place] [address]    08/16/2022 0.829 0.450 - 4.500 uIU/mL Final   T3, Total  Date Value Ref Range Status  10/21/2022 112 71 - 180 ng/dL Final    Comment:    (NOTE) Performed At: [facility] [address] [place], [place] [id number] Pearlean Botts MD MW:[id number]      ASSESSMENT / PLAN  1. Multiple thyroid  nodules   2. Left thyroid  nodule   3. Palpitation    - Patient has significant thyromegaly with left thyroid  nodule measuring 4.3 cm, s/p FNA biopsy in 12/2022 with benign cytology. -Repeat ultrasound thyroid  in February 2025 with stable left thyroid  nodule  measuring 2.1 cm and small stable right complex thyroid  nodules. - She has occasional neck discomfort and feels like neck swelling.  Discussed that thyroid  surgery can be considered if she develops significant neck compressive symptoms due to thyromegaly. - She complains of palpitation, it can be multifactorial.  Advised to discuss and follow-up with primary care provider as well may need cardiac monitoring if persistent.  Provided thyroid  function tests completed today normal, is unlikely to be related to thyroid  disorder.   Plan: -Check thyroid  function test. -Will monitor thyroid  nodules/goiter with serial ultrasound.  Patient is asked to call our clinic in between visits if she develop any new neck related symptoms. - Repeat ultrasound thyroid  in 1 year prior to follow-up visit.   Jamal was seen today for follow-up.  Diagnoses and all orders for this visit:  Multiple thyroid  nodules -     T4, free -     T3, free -     TSH  Left thyroid  nodule -     T4, free -     T3, free -     TSH -     US  THYROID ; Future  Palpitation -     T4, free -     T3, free -     TSH   DISPOSITION Follow up in clinic in 12 months suggested.  All questions answered and patient verbalized understanding of the plan.  Katie Kamee Bobst, MD [facility] [address] [place], [address] [place], [place]  [address] Phone # [phone]  At least part of this note was generated using voice recognition software. Inadvertent word errors may have occurred, which were not recognized during the proofreading process.

## 2023-08-08 ENCOUNTER — Encounter: Payer: Self-pay | Admitting: Endocrinology

## 2023-08-08 LAB — T4, FREE: Free T4: 1.1 ng/dL (ref 0.8–1.8)

## 2023-08-08 LAB — T3, FREE: T3, Free: 3.5 pg/mL (ref 2.3–4.2)

## 2023-08-08 LAB — TSH: TSH: 1.69 m[IU]/L

## 2023-10-19 ENCOUNTER — Other Ambulatory Visit (HOSPITAL_BASED_OUTPATIENT_CLINIC_OR_DEPARTMENT_OTHER): Payer: Self-pay | Admitting: Family Medicine

## 2023-10-19 DIAGNOSIS — N920 Excessive and frequent menstruation with regular cycle: Secondary | ICD-10-CM

## 2024-02-08 ENCOUNTER — Other Ambulatory Visit (HOSPITAL_BASED_OUTPATIENT_CLINIC_OR_DEPARTMENT_OTHER): Payer: Self-pay | Admitting: Family Medicine

## 2024-02-08 DIAGNOSIS — I10 Essential (primary) hypertension: Secondary | ICD-10-CM

## 2024-03-06 ENCOUNTER — Ambulatory Visit
Admission: RE | Admit: 2024-03-06 | Discharge: 2024-03-06 | Disposition: A | Discharge status: Home / Self Care | Source: Ambulatory Visit

## 2024-03-06 VITALS — BP 132/83 | HR 90 | Temp 97.4°F | Resp 16

## 2024-03-06 DIAGNOSIS — B9689 Other specified bacterial agents as the cause of diseases classified elsewhere: Secondary | ICD-10-CM

## 2024-03-06 DIAGNOSIS — N3001 Acute cystitis with hematuria: Secondary | ICD-10-CM

## 2024-03-06 DIAGNOSIS — N760 Acute vaginitis: Secondary | ICD-10-CM | POA: Diagnosis present

## 2024-03-06 LAB — POCT URINE DIPSTICK
Bilirubin, UA: NEGATIVE
Glucose, UA: NEGATIVE mg/dL
Ketones, POC UA: NEGATIVE mg/dL
Nitrite, UA: POSITIVE — AB
POC PROTEIN,UA: 100 — AB
Spec Grav, UA: >=1.03 — AB (ref 1.010–1.025)
Urobilinogen, UA: 0.2 U/dL
pH, UA: 6 (ref 5.0–8.0)

## 2024-03-06 LAB — POCT URINE PREGNANCY: Preg Test, Ur: NEGATIVE

## 2024-03-06 MED ORDER — SULFAMETHOXAZOLE-TRIMETHOPRIM 800-160 MG PO TABS: 1.0000 | ORAL_TABLET | Freq: Two times a day (BID) | ORAL | 0 refills | Status: AC

## 2024-03-06 MED ORDER — METRONIDAZOLE 500 MG PO TABS: 500.0000 mg | ORAL_TABLET | Freq: Two times a day (BID) | ORAL | 0 refills | Status: AC

## 2024-03-06 NOTE — ED Provider Notes (Signed)
 [facility]    CSN: [id number] Arrival date & time: 03/06/24  1426      History   Chief Complaint Chief Complaint  Patient presents with   Abdominal Pain    The pain is in my lower abdominal area as well vaginal area and tingling sensation in my right leg - Entered by patient    HPI Katie Garrett is a 43 y.o. female.   Patient presents today due to 4 days of lower abdominal pain, urinary frequency, dysuria, and malodorous vaginal discharge.  Patient states that pain is radiating down her right leg.  Patient denies fever, chills, or nausea today.  Patient states she had 1 episode of vomiting on the first day of symptoms.  The history is provided by the patient.  Abdominal Pain   Past Medical History:  Diagnosis Date   Chlamydia    Fibroid    Headache(784.0)    Pregnancy induced hypertension    Shortness of breath     Patient Active Problem List   Diagnosis Date Noted   Hypokalemia 01/18/2023   Unintentional weight loss 01/18/2023   Heart palpitations 01/18/2023   Fibroid uterus 07/28/2022    Past Surgical History:  Procedure Laterality Date   CESAREAN SECTION     CESAREAN SECTION WITH BILATERAL TUBAL LIGATION Bilateral 09/01/2012   Procedure: CESAREAN SECTION WITH BILATERAL TUBAL LIGATION;  Surgeon: Norleen LULLA Server, MD;  Location: [facility];  Service: Obstetrics;  Laterality: Bilateral;   DILATATION & CURETTAGE/HYSTEROSCOPY WITH MYOSURE N/A 01/31/2023   Procedure: DILATATION & CURETTAGE/HYSTEROSCOPY WITH MYOSURE;  Surgeon: Zina Jerilynn LABOR, MD;  Location: [facility] OR;  Service: Gynecology;  Laterality: N/A;   TOOTH EXTRACTION     TUBAL LIGATION  2014    OB History     Gravida  2   Para  2   Term  2   Preterm      AB      Living  2      SAB      IAB      Ectopic      Multiple      Live Births  2            Home Medications    Prior to Admission medications   Medication Sig Start Date End Date Taking? Authorizing  Provider  metroNIDAZOLE  (FLAGYL ) 500 MG tablet Take 1 tablet (500 mg total) by mouth 2 (two) times daily. 03/06/24  Yes Andra Krabbe C, PA-C  sulfamethoxazole-trimethoprim (BACTRIM DS) 800-160 MG tablet Take 1 tablet by mouth 2 (two) times daily for 5 days. 03/06/24 03/11/24 Yes Andra Krabbe C, PA-C  amLODipine  (NORVASC ) 10 MG tablet Take 1 tablet (10 mg total) by mouth daily. 02/07/23   Towana Small, FNP  aspirin-acetaminophen -caffeine  (EXCEDRIN MIGRAINE) 250-250-65 MG tablet Take 2 tablets by mouth every 6 (six) hours as needed for headache.    [provider]  famotidine  (PEPCID ) 20 MG tablet Take 1 tablet (20 mg total) by mouth daily. 10/21/22   Silver Wonda LABOR, PA  ferrous sulfate  325 (65 FE) MG tablet Take 1 tablet (325 mg total) by mouth every other day. 12/19/22   Butler, Kristina, FNP  naproxen  (NAPROSYN ) 500 MG tablet Take 1 tablet (500 mg total) by mouth 2 (two) times daily between meals as needed. 07/25/23   Arloa Suzen RAMAN, NP  ondansetron  (ZOFRAN -ODT) 4 MG disintegrating tablet 4mg  ODT q6 hours prn nausea/vomit 04/12/23   Zavitz, Joshua, MD  potassium chloride  (KLOR-CON  M)  10 MEQ tablet Take 10 mEq by mouth daily. 07/20/23   [provider]    Family History Family History  Problem Relation Age of Onset   Hypertension Mother    Cancer Mother    Hypertension Father     Social History Social History   Tobacco Use   Smoking status: Former    Current packs/day: 0.00    Types: Cigarettes    Quit date: 04/01/2012    Years since quitting: 11.9   Smokeless tobacco: Never  Vaping Use   Vaping status: Never Used  Substance Use Topics   Alcohol use: No   Drug use: No     Allergies   Onion   Review of Systems Review of Systems  Gastrointestinal:  Positive for abdominal pain.     Physical Exam Triage Vital Signs ED Triage Vitals  Encounter Vitals Group     BP 03/06/24 1536 132/83     Girls Systolic BP Percentile --      Girls  Diastolic BP Percentile --      Boys Systolic BP Percentile --      Boys Diastolic BP Percentile --      Pulse Rate 03/06/24 1536 90     Resp 03/06/24 1536 16     Temp 03/06/24 1536 (!) 97.4 F (36.3 C)     Temp Source 03/06/24 1536 Oral     SpO2 03/06/24 1536 99 %     Weight --      Height --      Head Circumference --      Peak Flow --      Pain Score 03/06/24 1533 10     Pain Loc --      Pain Education --      Exclude from Growth Chart --    No data found.  Updated Vital Signs BP 132/83 (BP Location: Right Arm)   Pulse 90   Temp (!) 97.4 F (36.3 C) (Oral)   Resp 16   LMP 02/17/2024 (Approximate)   SpO2 99%   Visual Acuity Right Eye Distance:   Left Eye Distance:   Bilateral Distance:    Right Eye Near:   Left Eye Near:    Bilateral Near:     Physical Exam Vitals and nursing note reviewed.  Constitutional:      General: She is not in acute distress.    Appearance: Normal appearance. She is not ill-appearing, toxic-appearing or diaphoretic.  Eyes:     General: No scleral icterus. Cardiovascular:     Rate and Rhythm: Normal rate and regular rhythm.     Heart sounds: Normal heart sounds.  Pulmonary:     Effort: Pulmonary effort is normal. No respiratory distress.     Breath sounds: Normal breath sounds. No wheezing or rhonchi.  Abdominal:     General: Abdomen is flat. Bowel sounds are normal.     Palpations: Abdomen is soft.     Tenderness: There is generalized abdominal tenderness and tenderness in the right lower quadrant, suprapubic area and left lower quadrant. There is no right CVA tenderness or left CVA tenderness.  Skin:    General: Skin is warm.  Neurological:     Mental Status: She is alert and oriented to person, place, and time.  Psychiatric:        Mood and Affect: Mood normal.        Behavior: Behavior normal.      UC Treatments / Results  Labs (all labs  ordered are listed, but only abnormal results are displayed) Labs Reviewed  POCT  URINE DIPSTICK - Abnormal; Notable for the following components:      Result Value   Clarity, UA cloudy (*)    Spec Grav, UA >=1.030 (*)    Blood, UA small (*)    POC PROTEIN,UA =100 (*)    Nitrite, UA Positive (*)    Leukocytes, UA Small (1+) (*)    All other components within normal limits  URINE CULTURE  POCT URINE PREGNANCY  CERVICOVAGINAL ANCILLARY ONLY    EKG   Radiology No results found.  Procedures Procedures (including critical care time)  Medications Ordered in UC Medications - No data to display  Initial Impression / Assessment and Plan / UC Course  I have reviewed the triage vital signs and the nursing notes.  Pertinent labs & imaging results that were available during my care of the patient were reviewed by me and considered in my medical decision making (see chart for details).   Final Clinical Impressions(s) / UC Diagnoses   Final diagnoses:  Acute cystitis with hematuria  Bacterial vaginitis   Discharge Instructions   None    ED Prescriptions     Medication Sig Dispense Auth. Provider   sulfamethoxazole -trimethoprim  (BACTRIM  DS) 800-160 MG tablet Take 1 tablet by mouth 2 (two) times daily for 5 days. 10 tablet Andra Krabbe C, PA-C   metroNIDAZOLE  (FLAGYL ) 500 MG tablet Take 1 tablet (500 mg total) by mouth 2 (two) times daily. 14 tablet Andra Krabbe BROCKS, PA-C      PDMP not reviewed this encounter.   Andra Krabbe BROCKS, PA-C 03/06/24 1623

## 2024-03-06 NOTE — ED Triage Notes (Addendum)
 Pt states lower abdominal pain, right groin pain and tingling that goes down her right leg for the past 4 days. States she did vomit on the first day.

## 2024-03-07 LAB — CERVICOVAGINAL ANCILLARY ONLY
Bacterial Vaginitis (gardnerella): POSITIVE — AB
Candida Glabrata: NEGATIVE
Candida Vaginitis: NEGATIVE
Chlamydia: NEGATIVE
Comment: NEGATIVE
Comment: NEGATIVE
Comment: NEGATIVE
Comment: NEGATIVE
Comment: NEGATIVE
Comment: NORMAL
Neisseria Gonorrhea: NEGATIVE
Trichomonas: POSITIVE — AB

## 2024-03-08 ENCOUNTER — Ambulatory Visit (HOSPITAL_COMMUNITY): Payer: Self-pay

## 2024-03-08 LAB — URINE CULTURE: Culture: >=100000 — AB

## 2024-03-08 MED ORDER — NITROFURANTOIN MONOHYD MACRO 100 MG PO CAPS: 100.0000 mg | ORAL_CAPSULE | Freq: Two times a day (BID) | ORAL | 0 refills | Status: AC

## 2024-04-29 ENCOUNTER — Encounter (HOSPITAL_BASED_OUTPATIENT_CLINIC_OR_DEPARTMENT_OTHER): Admitting: Family Medicine

## 2024-06-11 ENCOUNTER — Encounter (HOSPITAL_BASED_OUTPATIENT_CLINIC_OR_DEPARTMENT_OTHER): Admitting: Family Medicine

## 2024-08-07 ENCOUNTER — Ambulatory Visit: Admitting: Endocrinology
